# Patient Record
Sex: Male | Born: 1980 | Race: Black or African American | Hispanic: No | Marital: Single | Smoking: Current every day smoker
Health system: Southern US, Community
[De-identification: ages and names within clinical notes are randomized; demographics above are authoritative.]

## PROBLEM LIST (undated history)

## (undated) DIAGNOSIS — I10 Essential (primary) hypertension: Secondary | ICD-10-CM

---

## 2010-04-28 ENCOUNTER — Emergency Department: Payer: Self-pay

## 2011-09-01 ENCOUNTER — Emergency Department: Payer: Self-pay | Admitting: *Deleted

## 2011-11-22 ENCOUNTER — Emergency Department (HOSPITAL_COMMUNITY)
Admission: EM | Admit: 2011-11-22 | Discharge: 2011-11-22 | Discharge status: Against Medical Advice | Payer: Self-pay | Attending: Emergency Medicine | Admitting: Emergency Medicine

## 2011-11-22 ENCOUNTER — Encounter: Payer: Self-pay | Admitting: *Deleted

## 2011-11-22 DIAGNOSIS — S20219A Contusion of unspecified front wall of thorax, initial encounter: Secondary | ICD-10-CM | POA: Insufficient documentation

## 2011-11-22 DIAGNOSIS — Y9361 Activity, american tackle football: Secondary | ICD-10-CM | POA: Insufficient documentation

## 2011-11-22 DIAGNOSIS — W219XXA Striking against or struck by unspecified sports equipment, initial encounter: Secondary | ICD-10-CM | POA: Insufficient documentation

## 2011-11-22 DIAGNOSIS — R109 Unspecified abdominal pain: Secondary | ICD-10-CM | POA: Insufficient documentation

## 2011-11-22 DIAGNOSIS — M549 Dorsalgia, unspecified: Secondary | ICD-10-CM | POA: Insufficient documentation

## 2011-11-22 NOTE — ED Notes (Signed)
Patient leaving AMA

## 2011-11-22 NOTE — ED Notes (Signed)
Pt states he was playing foot ball and was kneed in left post ribs, hurst to take deep breath or move

## 2011-11-22 NOTE — ED Provider Notes (Signed)
History     CSN: [id number] Arrival date & time: 11/22/2011  5:05 PM   First MD Initiated Contact with Patient 11/22/11 1844      Chief Complaint  Patient presents with  . Back Pain    (Consider location/radiation/quality/duration/timing/severity/associated sxs/prior treatment) Patient is a 30 y.o. male presenting with back pain. The history is provided by the patient.  Back Pain  This is a new problem. The current episode started 3 to 5 hours ago. The problem occurs constantly. The problem has not changed since onset.Associated with: He was playing football and collided with another player, who fell onto left flank. He complains of worsening pain since injury, now with painful breathing. The pain is moderate. The symptoms are aggravated by bending, twisting and certain positions. The pain is worse during the day. Pertinent negatives include no chest pain and no fever. He has tried nothing for the symptoms.    History reviewed. No pertinent past medical history.  History reviewed. No pertinent past surgical history.  No family history on file.  History  Substance Use Topics  . Smoking status: Current Everyday Smoker  . Smokeless tobacco: Not on file  . Alcohol Use: Yes      Review of Systems  Constitutional: Negative for fever and chills.  HENT: Negative.   Respiratory:       Pleuritic pain posterior left - see HPI.  Cardiovascular: Negative.  Negative for chest pain.  Gastrointestinal: Negative.   Genitourinary: Positive for flank pain.       See HPI.  Skin: Negative.   Neurological: Negative.     Allergies  Review of patient's allergies indicates no known allergies.  Home Medications  No current outpatient prescriptions on file.  BP 134/80  Pulse 72  Temp(Src) 98.8 F (37.1 C) (Oral)  Resp 21  SpO2 100%  Physical Exam  Constitutional: He is oriented to person, place, and time. He appears well-developed and well-nourished.  Neck: Normal range of motion.   Pulmonary/Chest: Effort normal and breath sounds normal.  Abdominal: Soft. There is no tenderness.  Genitourinary:       Pain is not increased to palpation. No swelling, ecchymosis at left flank.   Musculoskeletal: Normal range of motion.  Neurological: He is alert and oriented to person, place, and time.  Skin: Skin is warm and dry.  Psychiatric: He has a normal mood and affect.    ED Course  Procedures (including critical care time)  Labs Reviewed - No data to display No results found.   No diagnosis found.    MDM  The patient states he cannot wait for further evaluation. Recommended chest x-ray with ribs and urine for hemoglobin but he states he cannot wait for results. He chooses to leave AMA and states he will return later tonight.        Rodena Medin, PA 11/22/11 [phone]

## 2011-11-23 ENCOUNTER — Emergency Department (HOSPITAL_COMMUNITY)
Admission: EM | Admit: 2011-11-23 | Discharge: 2011-11-23 | Disposition: A | Discharge status: Home / Self Care | Payer: Self-pay | Attending: Emergency Medicine | Admitting: Emergency Medicine

## 2011-11-23 ENCOUNTER — Encounter (HOSPITAL_COMMUNITY): Payer: Self-pay | Admitting: *Deleted

## 2011-11-23 ENCOUNTER — Emergency Department (HOSPITAL_COMMUNITY): Payer: Self-pay

## 2011-11-23 DIAGNOSIS — S20219A Contusion of unspecified front wall of thorax, initial encounter: Secondary | ICD-10-CM

## 2011-11-23 MED ORDER — KETOROLAC TROMETHAMINE 60 MG/2ML IM SOLN
60.0000 mg | Freq: Once | INTRAMUSCULAR | Status: AC
  Administered 2011-11-23: 60 mg via INTRAMUSCULAR
  Filled 2011-11-23: qty 2

## 2011-11-23 MED ORDER — NAPROXEN 500 MG PO TABS: 500.0000 mg | ORAL_TABLET | Freq: Two times a day (BID) | ORAL | Status: DC

## 2011-11-23 NOTE — ED Provider Notes (Signed)
Medical screening examination/treatment/procedure(s) were performed by non-physician practitioner and as supervising physician I was immediately available for consultation/collaboration.  Raeford Razor, MD 11/23/11 [phone]

## 2011-11-23 NOTE — ED Notes (Signed)
Pt was playing football yesterday when he was hit by what he believes was a knee in his left flank area.  Pt presented to [facility] earlier yesterday but left due to wait time.  Pt returns tonight because the pain in his back is worse.  Pt denies hematuria or vomitus.
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
## 2011-12-09 ENCOUNTER — Emergency Department: Payer: Self-pay | Admitting: Emergency Medicine

## 2012-06-17 ENCOUNTER — Emergency Department (HOSPITAL_COMMUNITY): Payer: Self-pay

## 2012-06-17 ENCOUNTER — Encounter (HOSPITAL_COMMUNITY): Payer: Self-pay | Admitting: *Deleted

## 2012-06-17 ENCOUNTER — Emergency Department (HOSPITAL_COMMUNITY)
Admission: EM | Admit: 2012-06-17 | Discharge: 2012-06-17 | Disposition: A | Discharge status: Home / Self Care | Payer: Self-pay | Attending: Emergency Medicine | Admitting: Emergency Medicine

## 2012-06-17 DIAGNOSIS — W230XXA Caught, crushed, jammed, or pinched between moving objects, initial encounter: Secondary | ICD-10-CM | POA: Insufficient documentation

## 2012-06-17 DIAGNOSIS — M79609 Pain in unspecified limb: Secondary | ICD-10-CM | POA: Insufficient documentation

## 2012-06-17 DIAGNOSIS — IMO0001 Reserved for inherently not codable concepts without codable children: Secondary | ICD-10-CM

## 2012-06-17 DIAGNOSIS — S6990XA Unspecified injury of unspecified wrist, hand and finger(s), initial encounter: Secondary | ICD-10-CM | POA: Insufficient documentation

## 2012-06-17 DIAGNOSIS — R030 Elevated blood-pressure reading, without diagnosis of hypertension: Secondary | ICD-10-CM | POA: Insufficient documentation

## 2012-06-17 DIAGNOSIS — Y9361 Activity, american tackle football: Secondary | ICD-10-CM | POA: Insufficient documentation

## 2012-06-17 MED ORDER — IBUPROFEN 600 MG PO TABS: 600.0000 mg | ORAL_TABLET | Freq: Four times a day (QID) | ORAL | Status: AC | PRN

## 2012-06-17 MED ORDER — IBUPROFEN 800 MG PO TABS
800.0000 mg | ORAL_TABLET | Freq: Once | ORAL | Status: AC
  Administered 2012-06-17: 800 mg via ORAL
  Filled 2012-06-17: qty 1

## 2012-06-17 MED ORDER — OXYCODONE-ACETAMINOPHEN 5-325 MG PO TABS: 2.0000 | ORAL_TABLET | ORAL | Status: AC | PRN

## 2012-06-17 NOTE — ED Provider Notes (Signed)
History     CSN: [id number]  Arrival date & time 06/17/12  6578   First MD Initiated Contact with Patient 06/17/12 0932      Chief Complaint  Patient presents with  . Hand Pain    (Consider location/radiation/quality/duration/timing/severity/associated sxs/prior treatment) HPI  Presents with right hand pain. Patient states he was playing football last night and "jammed my finger "while trying to tackle another person. He complains of pain and swelling to the right thumb as well as hand. He complains of numbness to the distal aspect of his right thumb. Denies paresthesias. He denies wrist pain. He denies other injury related to the fall, including blunt head trauma, neck pain, back pain. He is taking nothing at home prior to arrival. He rates his pain currently ia 6/10.   ED Notes, ED Provider Notes from 06/17/12 0000 to 06/17/12 09:26:00       Logan Buzzard, RN 06/17/2012 09:24      Pt states he was playing football yesterday and injured his hand. Pt states he applied ice to hand last night and it has continue to increase in pain and swellin     History reviewed. No pertinent past medical history.  History reviewed. No pertinent past surgical history.  No family history on file.  History  Substance Use Topics  . Smoking status: Current Everyday Smoker  . Smokeless tobacco: Not on file  . Alcohol Use: Yes    Review of Systems  All other systems reviewed and are negative.  except as noted HPI  Allergies  Review of patient's allergies indicates no known allergies.  Home Medications   Current Outpatient Rx  Name Route Sig Dispense Refill  . ESOMEPRAZOLE MAGNESIUM 40 MG PO CPDR Oral Take 40 mg by mouth daily before breakfast.    . IBUPROFEN 600 MG PO TABS Oral Take 1 tablet (600 mg total) by mouth every 6 (six) hours as needed for pain. 30 tablet 0  . OXYCODONE-ACETAMINOPHEN 5-325 MG PO TABS Oral Take 2 tablets by mouth every 4 (four) hours as needed for pain. 6  tablet 0    BP 157/100  Pulse 74  Temp 98.5 F (36.9 C) (Oral)  Resp 18  SpO2 99%  Physical Exam  Nursing note and vitals reviewed. Constitutional: He is oriented to person, place, and time. He appears well-developed and well-nourished. No distress.  HENT:  Head: Atraumatic.  Mouth/Throat: Oropharynx is clear and moist.  Eyes: Conjunctivae are normal. Pupils are equal, round, and reactive to light.  Neck: Neck supple.  Cardiovascular: Normal rate, regular rhythm, normal heart sounds and intact distal pulses.  Exam reveals no gallop and no friction rub.   No murmur heard. Pulmonary/Chest: Effort normal. No respiratory distress. He has no wheezes. He has no rales.  Abdominal: Soft. Bowel sounds are normal. There is no tenderness. There is no rebound and no guarding.  Musculoskeletal: Normal range of motion. He exhibits edema and tenderness.       Hands:      Gross sensation intact Cap refill < 3 sec Limited ROM 2/2 pain  Neurological: He is alert and oriented to person, place, and time.  Skin: Skin is warm and dry.  Psychiatric: He has a normal mood and affect.    ED Course  Procedures (including critical care time)  Labs Reviewed - No data to display Dg Hand Complete Right  06/17/2012  *RADIOLOGY REPORT*  Clinical Data: Hurt hand playing football, pain  RIGHT HAND - COMPLETE 3+  VIEW  Comparison: None  Findings: Osseous mineralization normal. Joint spaces preserved. Thin linear bony density is identified adjacent to the ulnar aspect of the distal first metacarpal, of uncertain etiology. This appears somewhat remote from the joint making this less likely represent capsular avulsion. No additional fracture, dislocation or bone destruction identified. Remaining soft tissues unremarkable.  IMPRESSION: Linear calcific density of uncertain etiology adjacent to the distal first metacarpal. This is unlikely to be related to acute trauma but recommend clinical exclusion of pain/tenderness  at this site to exclude atypical injury.  Original Report Authenticated By: Lollie Marrow, M.D.     1. Hand injury   2. Elevated blood pressure       MDM  Hand injury resulting in thumb pain/swelling. Unable to fully assess tendon injury as pt with dec ROM/severe pain. Neuro intact. XR with possible/unlikely fx. Plan for splint and f/u hand surgery for recheck. Elevated blood pressure noted likely 2/2 pain. Will recheck prior to discharge and have pt f/u with pmd for recheck 2-3 days. No EMC precluding discharge at this time. Given Precautions for return. PMD f/u.        Forbes Cellar, MD 06/17/12 1038

## 2012-06-17 NOTE — ED Notes (Signed)
Pt states he was playing football yesterday and injured his hand. Pt states he applied ice to hand last night and it has continue to increase in pain and swellin

## 2012-06-17 NOTE — ED Notes (Signed)
[facility] at bedside

## 2013-01-19 ENCOUNTER — Emergency Department: Payer: Self-pay | Admitting: Internal Medicine

## 2014-12-08 ENCOUNTER — Emergency Department: Payer: Self-pay | Admitting: Emergency Medicine

## 2014-12-08 LAB — LIPASE, BLOOD: LIPASE: 261 U/L (ref 73–393)

## 2014-12-08 LAB — CBC WITH DIFFERENTIAL/PLATELET
Basophil #: 0.1 10*3/uL (ref 0.0–0.1)
Basophil %: 0.9 %
EOS PCT: 1.5 %
Eosinophil #: 0.1 10*3/uL (ref 0.0–0.7)
HCT: 52.4 % — ABNORMAL HIGH (ref 40.0–52.0)
HGB: 17.9 g/dL (ref 13.0–18.0)
LYMPHS ABS: 1.8 10*3/uL (ref 1.0–3.6)
Lymphocyte %: 25.1 %
MCH: 31.9 pg (ref 26.0–34.0)
MCHC: 34.2 g/dL (ref 32.0–36.0)
MCV: 93 fL (ref 80–100)
Monocyte #: 1 x10 3/mm (ref 0.2–1.0)
Monocyte %: 14.4 %
NEUTROS PCT: 58.1 %
Neutrophil #: 4.2 10*3/uL (ref 1.4–6.5)
PLATELETS: 272 10*3/uL (ref 150–440)
RBC: 5.63 10*6/uL (ref 4.40–5.90)
RDW: 13.2 % (ref 11.5–14.5)
WBC: 7.3 10*3/uL (ref 3.8–10.6)

## 2014-12-08 LAB — URINALYSIS, COMPLETE
BILIRUBIN, UR: NEGATIVE
BLOOD: NEGATIVE
Bacteria: NONE SEEN
Glucose,UR: NEGATIVE mg/dL (ref 0–75)
Ketone: NEGATIVE
LEUKOCYTE ESTERASE: NEGATIVE
Nitrite: NEGATIVE
PH: 5 (ref 4.5–8.0)
PROTEIN: NEGATIVE
RBC,UR: <1 /HPF (ref 0–5)
Specific Gravity: 1.021 (ref 1.003–1.030)
WBC UR: 1 /HPF (ref 0–5)

## 2014-12-08 LAB — COMPREHENSIVE METABOLIC PANEL
ALK PHOS: 80 U/L
ALT: 42 U/L
Albumin: 3.9 g/dL (ref 3.4–5.0)
Anion Gap: 7 (ref 7–16)
BUN: 9 mg/dL (ref 7–18)
Bilirubin,Total: 0.3 mg/dL (ref 0.2–1.0)
CALCIUM: 8.7 mg/dL (ref 8.5–10.1)
CO2: 26 mmol/L (ref 21–32)
Chloride: 107 mmol/L (ref 98–107)
Creatinine: 1.16 mg/dL (ref 0.60–1.30)
GLUCOSE: 90 mg/dL (ref 65–99)
OSMOLALITY: 278 (ref 275–301)
Potassium: 3.7 mmol/L (ref 3.5–5.1)
SGOT(AST): 32 U/L (ref 15–37)
Sodium: 140 mmol/L (ref 136–145)
Total Protein: 7.8 g/dL (ref 6.4–8.2)

## 2015-11-30 ENCOUNTER — Emergency Department
Admission: EM | Admit: 2015-11-30 | Discharge: 2015-11-30 | Disposition: A | Discharge status: Home / Self Care | Payer: No Typology Code available for payment source | Attending: Emergency Medicine | Admitting: Emergency Medicine

## 2015-11-30 ENCOUNTER — Encounter: Payer: Self-pay | Admitting: Emergency Medicine

## 2015-11-30 ENCOUNTER — Emergency Department: Payer: No Typology Code available for payment source

## 2015-11-30 DIAGNOSIS — Y998 Other external cause status: Secondary | ICD-10-CM | POA: Diagnosis not present

## 2015-11-30 DIAGNOSIS — S8001XA Contusion of right knee, initial encounter: Secondary | ICD-10-CM | POA: Diagnosis not present

## 2015-11-30 DIAGNOSIS — Y9301 Activity, walking, marching and hiking: Secondary | ICD-10-CM | POA: Insufficient documentation

## 2015-11-30 DIAGNOSIS — R030 Elevated blood-pressure reading, without diagnosis of hypertension: Secondary | ICD-10-CM | POA: Diagnosis not present

## 2015-11-30 DIAGNOSIS — Z79899 Other long term (current) drug therapy: Secondary | ICD-10-CM | POA: Diagnosis not present

## 2015-11-30 DIAGNOSIS — Y92481 Parking lot as the place of occurrence of the external cause: Secondary | ICD-10-CM | POA: Diagnosis not present

## 2015-11-30 DIAGNOSIS — M25461 Effusion, right knee: Secondary | ICD-10-CM | POA: Diagnosis not present

## 2015-11-30 DIAGNOSIS — S8991XA Unspecified injury of right lower leg, initial encounter: Secondary | ICD-10-CM | POA: Diagnosis present

## 2015-11-30 DIAGNOSIS — F17200 Nicotine dependence, unspecified, uncomplicated: Secondary | ICD-10-CM | POA: Diagnosis not present

## 2015-11-30 DIAGNOSIS — IMO0001 Reserved for inherently not codable concepts without codable children: Secondary | ICD-10-CM
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
## 2015-11-30 NOTE — Discharge Instructions (Signed)
Contusion A contusion is a deep bruise. Contusions happen when an injury causes bleeding under the skin. Symptoms of bruising include pain, swelling, and discolored skin. The skin may turn blue, purple, or yellow. HOME CARE   Rest the injured area.  If told, put ice on the injured area.  Put ice in a plastic bag.  Place a towel between your skin and the bag.  Leave the ice on for 20 minutes, 2-3 times per day.  If told, put light pressure (compression) on the injured area using an elastic bandage. Make sure the bandage is not too tight. Remove it and put it back on as told by your doctor.  If possible, raise (elevate) the injured area above the level of your heart while you are sitting or lying down.  Take over-the-counter and prescription medicines only as told by your doctor. GET HELP IF:  Your symptoms do not get better after several days of treatment.  Your symptoms get worse.  You have trouble moving the injured area. GET HELP RIGHT AWAY IF:   You have very bad pain.  You have a loss of feeling (numbness) in a hand or foot.  Your hand or foot turns pale or cold.   This information is not intended to replace advice given to you by your health care provider. Make sure you discuss any questions you have with your health care provider.   Document Released: 05/12/2008 Document Revised: 08/15/2015 Document Reviewed: 04/11/2015 Elsevier Interactive Patient Education 2016 [employer]  Knee Effusion Knee effusion means that you have extra fluid in your knee. This can cause pain. Your knee may be more difficult to bend and move. HOME CARE  Use crutches as told by your doctor.  Wear a knee brace as told by your doctor.  Apply ice to the swollen area:  Put ice in a plastic bag.  Place a towel between your skin and the bag.  Leave the ice on for 20 minutes, 2-3 times per day.  Keep your knee raised (elevated) when you are sitting or lying down.  Take medicines only  as told by your doctor.  Do any rehabilitation or strengthening exercises as told by your doctor.  Rest your knee as told by your doctor. You may start doing your normal activities again when your doctor says it is okay.  Keep all follow-up visits as told by your doctor. This is important. GET HELP IF:   You continue to have pain in your knee. GET HELP RIGHT AWAY IF:  You have increased swelling or redness of your knee.  You have severe pain in your knee.  You have a fever.   This information is not intended to replace advice given to you by your health care provider. Make sure you discuss any questions you have with your health care provider.   Document Released: 12/27/2010 Document Revised: 12/15/2014 Document Reviewed: 07/10/2014 Elsevier Interactive Patient Education [employer]   Your exam and x-ray are normal today. There is no evidence of fracture or dislocation to your knee.  Apply ice and take Tylenol as needed. Monitor your blood pressure and follow[facility]up with [facility] for further evaluation and care.

## 2015-12-04 ENCOUNTER — Other Ambulatory Visit
Admission: RE | Admit: 2015-12-04 | Discharge: 2015-12-04 | Disposition: A | Discharge status: Home / Self Care | Payer: No Typology Code available for payment source | Source: Other Acute Inpatient Hospital | Attending: Family Medicine | Admitting: Family Medicine

## 2015-12-04 DIAGNOSIS — M25561 Pain in right knee: Secondary | ICD-10-CM | POA: Insufficient documentation

## 2015-12-04 DIAGNOSIS — I10 Essential (primary) hypertension: Secondary | ICD-10-CM | POA: Insufficient documentation

## 2015-12-04 DIAGNOSIS — G8929 Other chronic pain: Secondary | ICD-10-CM | POA: Diagnosis present

## 2015-12-04 LAB — BASIC METABOLIC PANEL
Anion gap: 12 (ref 5–15)
BUN: 16 mg/dL (ref 6–20)
CO2: 26 mmol/L (ref 22–32)
Calcium: 9.3 mg/dL (ref 8.9–10.3)
Chloride: 102 mmol/L (ref 101–111)
Creatinine, Ser: 0.92 mg/dL (ref 0.61–1.24)
GFR calc Af Amer: >60 mL/min (ref >60)
GFR calc non Af Amer: >60 mL/min (ref >60)
Glucose, Bld: 91 mg/dL (ref 65–99)
Potassium: 3.8 mmol/L (ref 3.5–5.1)
Sodium: 140 mmol/L (ref 135–145)

## 2016-03-26 ENCOUNTER — Encounter: Payer: Self-pay | Admitting: Medical Oncology

## 2016-03-26 ENCOUNTER — Emergency Department: Payer: No Typology Code available for payment source

## 2016-03-26 ENCOUNTER — Emergency Department
Admission: EM | Admit: 2016-03-26 | Discharge: 2016-03-26 | Disposition: A | Discharge status: Home / Self Care | Payer: No Typology Code available for payment source | Attending: Student | Admitting: Student

## 2016-03-26 DIAGNOSIS — S62336A Displaced fracture of neck of fifth metacarpal bone, right hand, initial encounter for closed fracture: Secondary | ICD-10-CM | POA: Insufficient documentation

## 2016-03-26 DIAGNOSIS — W228XXA Striking against or struck by other objects, initial encounter: Secondary | ICD-10-CM | POA: Insufficient documentation

## 2016-03-26 DIAGNOSIS — F17200 Nicotine dependence, unspecified, uncomplicated: Secondary | ICD-10-CM | POA: Insufficient documentation

## 2016-03-26 DIAGNOSIS — Y9239 Other specified sports and athletic area as the place of occurrence of the external cause: Secondary | ICD-10-CM | POA: Insufficient documentation

## 2016-03-26 DIAGNOSIS — Y9343 Activity, gymnastics: Secondary | ICD-10-CM | POA: Insufficient documentation

## 2016-03-26 DIAGNOSIS — S62339A Displaced fracture of neck of unspecified metacarpal bone, initial encounter for closed fracture: Secondary | ICD-10-CM

## 2016-03-26 DIAGNOSIS — I10 Essential (primary) hypertension: Secondary | ICD-10-CM | POA: Insufficient documentation

## 2016-03-26 DIAGNOSIS — Y999 Unspecified external cause status: Secondary | ICD-10-CM | POA: Insufficient documentation

## 2016-03-26 HISTORY — DX: Essential (primary) hypertension: I10

## 2016-03-26 MED ORDER — OXYCODONE-ACETAMINOPHEN 5-325 MG PO TABS
1.0000 | ORAL_TABLET | ORAL | Status: DC | PRN
Start: 2016-03-26 — End: 2018-03-05

## 2016-03-26 MED ORDER — OXYCODONE-ACETAMINOPHEN 5-325 MG PO TABS
2.0000 | ORAL_TABLET | Freq: Once | ORAL | Status: AC
  Administered 2016-03-26: 2 via ORAL
  Filled 2016-03-26: qty 2

## 2016-03-26 NOTE — ED Notes (Signed)
Pt reports that he was hitting punching bag at gym Friday when he heard a "pop" to his rt hand.

## 2016-03-26 NOTE — Discharge Instructions (Signed)
IMPRESSION: Acute fracture distal fifth metacarpal with volar angulation Distally  Metacarpal Fracture A metacarpal fracture is a break (fracture) of a bone in the hand. Metacarpals are the bones that extend from your knuckles to your wrist. In each hand, you have five metacarpal bones that connect your fingers and your thumb to your wrist. Some hand fractures have bone pieces that are close together and stable (simple). These fractures may be treated with only a splint or cast. Hand fractures that have many pieces of broken bone (comminuted), unstable bone pieces (displaced), or a bone that breaks through the skin (compound) usually require surgery. CAUSES This injury may be caused by:  A fall.  A hard, direct hit to your hand.  An injury that squeezes your knuckle, stretches your finger out of place, or crushes your hand. RISK FACTORS This injury is more likely to occur if:  You play contact sports.  You have certain bone diseases. SYMPTOMS  Symptoms of this type of fracture develop soon after the injury. Symptoms may include:  Swelling.  Pain.  Stiffness.  Increased pain with movement.  Bruising.  Inability to move a finger.  A shortened finger.  A finger knuckle that looks sunken in.  Unusual appearance of the hand or finger (deformity). DIAGNOSIS  This injury may be diagnosed based on your signs and symptoms, especially if you had a recent hand injury. Your health care provider will perform a physical exam. He or she may also order X-rays to confirm the diagnosis.  TREATMENT  Treatment for this injury depends on the type of fracture you have and how severe it is. Possible treatments include:  Non-reduction. This can be done if the bone does not need to be moved back into place. The fracture can be casted or splinted as it is.   Closed reduction. If your bone is stable and can be moved back into place, you may only need to wear a cast or splint or have buddy  taping.  Closed reduction with internal fixation (CRIF). This is the most common treatment. You may have this procedure if your bone can be moved back into place but needs more support. Wires, pins, or screws may be inserted through your skin to stabilize the fracture.  Open reduction with internal fixation (ORIF). This may be needed if your fracture is severe and unstable. It involves surgery to move your bone back into the right position. Screws, wires, or plates are used to stabilize the fracture. After all procedures, you may need to wear a cast or a splint for several weeks. You will also need to have follow-up X-rays to make sure that the bone is healing well and staying in position. After you no longer need your cast or splint, you may need physical therapy. This will help you to regain full movement and strength in your hand.  HOME CARE INSTRUCTIONS  If You Have a Cast:  Do not stick anything inside the cast to scratch your skin. Doing that increases your risk of infection.  Check the skin around the cast every day. Report any concerns to your health care provider. You may put lotion on dry skin around the edges of the cast. Do not apply lotion to the skin underneath the cast. If You Have a Splint:  Wear it as directed by your health care provider. Remove it only as directed by your health care provider.  Loosen the splint if your fingers become numb and tingle, or if they turn cold  and blue. Bathing  Cover the cast or splint with a watertight plastic bag to protect it from water while you take a bath or a shower. Do not let the cast or splint get wet. Managing Pain, Stiffness, and Swelling  If directed, apply ice to the injured area (if you have a splint, not a cast):  Put ice in a plastic bag.  Place a towel between your skin and the bag.  Leave the ice on for 20 minutes, 2-3 times a day.  Move your fingers often to avoid stiffness and to lessen swelling.  Raise the injured  area above the level of your heart while you are sitting or lying down. Driving  Do not drive or operate heavy machinery while taking pain medicine.  Do not drive while wearing a cast or splint on a hand that you use for driving. Activity  Return to your normal activities as directed by your health care provider. Ask your health care provider what activities are safe for you. General Instructions  Do not put pressure on any part of the cast or splint until it is fully hardened. This may take several hours.  Keep the cast or splint clean and dry.  Do not use any tobacco products, including cigarettes, chewing tobacco, or electronic cigarettes. Tobacco can delay bone healing. If you need help quitting, ask your health care provider.  Take medicines only as directed by your health care provider.  Keep all follow-up visits as directed by your health care provider. This is important. SEEK MEDICAL CARE IF:   Your pain is getting worse.  You have redness, swelling, or pain in the injured area.   You have fluid, blood, or pus coming from under your cast or splint.   You notice a bad smell coming from under your cast or splint.   You have a fever.  SEEK IMMEDIATE MEDICAL CARE IF:   You develop a rash.   You have trouble breathing.   Your skin or nails on your injured hand turn blue or gray even after you loosen your splint.  Your injured hand feels cold or becomes numb even after you loosen your splint.   You develop severe pain under the cast or in your hand.   This information is not intended to replace advice given to you by your health care provider. Make sure you discuss any questions you have with your health care provider.   Document Released: 11/24/2005 Document Revised: 08/15/2015 Document Reviewed: 09/13/2014 Elsevier Interactive Patient Education 2016 Elsevier Inc.   Boxer's Knuckle Boxer's knuckle is an injury to an extensor tendon. The extensor tendons  are located on the back of the hand. They help the fingers to extend. They also help to protect the finger bones and joints. Boxer's knuckle develops if the layer of tissue that lies over these tendons becomes damaged and causes a tendon to move out of position. Boxer's knuckle often affects the first knuckle of the middle finger. CAUSES This condition is caused by direct or repeated injury (trauma) to a knuckle. If often happens during activities such boxing or martial arts.  RISK FACTORS This condition is more likely to develop in:  People who participate in hitting or fighting sports, such as boxing and martial arts.  People who play contact sports, such as football and rugby.  People who have poor strength and flexibility.  People who have injured a knuckle. SYMPTOMS  Symptoms of this condition include:  Pain and swelling over the  injured knuckle.  Difficulty straightening the affected finger.  Delay when you try to straighten the affected finger.  Tenderness when you touch the injured knuckle.  Abnormal movement of the affected tendon when you open and close your hand. DIAGNOSIS This condition is diagnosed with a physical exam. Sometimes, X-rays are taken to check for additional problems, such as a fracture or cyst in the bone under the injured area. TREATMENT This condition may be treated with:  Ice applied to the affected area.  Medicines for pain.  Placing the hand in a cast or splint to keep the injured joint from moving while the tendon heals.  Surgery to repair the injured tendon or tissue. This may be done in severe cases. HOME CARE INSTRUCTIONS If You Have a Cast:  Do not stick anything inside the cast to scratch your skin. Doing that increases your risk of infection.  Check the skin around the cast every day. Report any concerns to your health care provider. You may put lotion on dry skin around the edges of the cast. Do not apply lotion to the skin underneath  the cast.  Keep the cast clean and dry. If You Have a Splint:  Wear it as told by your health care provider. Remove it only as told by your health care provider.  Loosen the splint if your fingers become numb and tingle, or if they turn cold and blue.  Keep the splint clean and dry. Bathing  Do not take baths, swim, or use a hot tub until your health care provider approves. Ask your health care provider if you can take showers. You may only be allowed to take sponge baths for bathing.  If your health care provider approves bathing and showering, cover the cast or splint with a watertight plastic bag to protect it from water. Do not let the cast or splint get wet. Managing Pain, Stiffness, and Swelling  If directed, apply ice to the injured area.  Put ice in a plastic bag.  Place a towel between your skin and the bag.  Leave the ice on for 20 minutes, 2-3 times per day. Driving  Do not drive or operate heavy machinery while taking prescription pain medicine.  Ask your health care provider when it is safe to drive if you have a cast or splint on your hand. General Instructions  Do not put pressure on any part of the cast or splint until it is fully hardened. This may take several hours.  Do not use any tobacco products, including cigarettes, chewing tobacco, or e-cigarettes. Tobacco can delay bone healing. If you need help quitting, ask your health care provider.  Take over-the-counter and prescription medicines only as told by your health care provider.  Keep all follow-up visits as told by your health care provider. This is important. SEEK MEDICAL CARE IF:  Your pain gets worse.  You hand tingles or feels numb.  Your hand becomes discolored.   This information is not intended to replace advice given to you by your health care provider. Make sure you discuss any questions you have with your health care provider.   Document Released: 11/24/2005 Document Revised:  08/15/2015 Document Reviewed: 01/25/2015 Elsevier Interactive Patient Education [employer] Elsevier Inc.

## 2016-03-26 NOTE — ED Provider Notes (Signed)
[facility] Emergency Department Provider Note  ____________________________________________  Time seen: Approximately 11:37 AM  I have reviewed the triage vital signs and the nursing notes.   HISTORY  Chief Complaint Hand Pain    HPI Logan Lamb is a 35 y.o. male who presents for evaluation of punching a bag at the gym on Friday when he heard a pop to his right hand. Patient complains of continuous pain today 5 days later. Patient reports past medical history of trauma to the same hand and knuckle approximately 10 years ago almost 20 years ago. Patient describes his pain as 9/10 with no numbness or tingling. Point tenderness noted to the base of the fifth finger according to the patient. No relief with over-the-counter medications.   Past Medical History  Diagnosis Date  . Hypertension     There are no active problems to display for this patient.   History reviewed. No pertinent past surgical history.  Current Outpatient Rx  Name  Route  Sig  Dispense  Refill  . esomeprazole (NEXIUM) 40 MG capsule   Oral   Take 40 mg by mouth daily before breakfast.         . oxyCODONE-acetaminophen (ROXICET) 5-325 MG tablet   Oral   Take 1-2 tablets by mouth every 4 (four) hours as needed for severe pain.   15 tablet   0     Allergies Review of patient's allergies indicates no known allergies.  No family history on file.  Social History Social History  Substance Use Topics  . Smoking status: Current Every Day Smoker  . Smokeless tobacco: None  . Alcohol Use: Yes    Review of Systems Constitutional: No fever/chills Musculoskeletal: Positive for right hand pain fifth digit. Skin: Negative for rash. Neurological: Negative for headaches, focal weakness or numbness.  10-point ROS otherwise negative.  ____________________________________________   PHYSICAL EXAM:  VITAL SIGNS: ED Triage Vitals  Enc Vitals Group     BP 03/26/16 1048 159/98  mmHg     Pulse Rate 03/26/16 1048 67     Resp 03/26/16 1048 18     Temp 03/26/16 1048 98.3 F (36.8 C)     Temp Source 03/26/16 1048 Oral     SpO2 03/26/16 1048 100 %     Weight 03/26/16 1048 178 lb (80.74 kg)     Height 03/26/16 1048  (1.651 m)     Head Cir --      Peak Flow --      Pain Score 03/26/16 1048 9     Pain Loc --      Pain Edu? --      Excl. in GC? --     Constitutional: Alert and oriented. Well appearing and in no acute distress. Musculoskeletal:Right hand fifth pain. Positive edema and swellingto the fifth finger. Minimal ecchymosis noted point tenderness appreciated. Neurologic:  Normal speech and language. No gross focal neurologic deficits are appreciated. No gait instability. Skin:  Skin is warm, dry and intact. No rash noted. Psychiatric: Mood and affect are normal. Speech and behavior are normal.  ____________________________________________   LABS (all labs ordered are listed, but only abnormal results are displayed)  Labs Reviewed - No data to display   RADIOLOGY  IMPRESSION: Acute fracture distal fifth metacarpal with volar angulation distally. There is also evidence of old fracture with remodeling in this area. No other fractures. No dislocation. No appreciable arthropathy. ____________________________________________   PROCEDURES  Procedure(s) performed: None  Critical Care  performed: No  ____________________________________________   INITIAL IMPRESSION / ASSESSMENT AND PLAN / ED COURSE  Pertinent labs & imaging results that were available during my care of the patient were reviewed by me and considered in my medical decision making (see chart for details).  Boxer's fracture with some volar angulation. Patient placed in ulnar gutter splint and referred to orthopedic on call. Discussed clinical findings with Dr. Luiz OchoaPucci and he agrees to see the patient is office this week. For Percocet 5/325 for  pain. ____________________________________________   FINAL CLINICAL IMPRESSION(S) / ED DIAGNOSES  Final diagnoses:  Boxer's fracture, closed, initial encounter     This chart was dictated using voice recognition software/Dragon. Despite best efforts to proofread, errors can occur which can change the meaning. Any change was purely unintentional.   Evangeline Dakinharles M Seara Hinesley, PA-C 03/26/16 1152  Gayla DossEryka A Gayle, MD 03/26/16 1525

## 2016-03-26 NOTE — ED Notes (Signed)
Patient at xray

## 2016-07-28 ENCOUNTER — Emergency Department: Payer: Self-pay

## 2016-07-28 ENCOUNTER — Encounter: Payer: Self-pay | Admitting: Emergency Medicine

## 2016-07-28 ENCOUNTER — Emergency Department
Admission: EM | Admit: 2016-07-28 | Discharge: 2016-07-29 | Disposition: A | Discharge status: Home / Self Care | Payer: Self-pay | Attending: Emergency Medicine | Admitting: Emergency Medicine

## 2016-07-28 DIAGNOSIS — R109 Unspecified abdominal pain: Secondary | ICD-10-CM

## 2016-07-28 DIAGNOSIS — F17200 Nicotine dependence, unspecified, uncomplicated: Secondary | ICD-10-CM | POA: Insufficient documentation

## 2016-07-28 DIAGNOSIS — I10 Essential (primary) hypertension: Secondary | ICD-10-CM

## 2016-07-28 DIAGNOSIS — Z79899 Other long term (current) drug therapy: Secondary | ICD-10-CM | POA: Insufficient documentation

## 2016-07-28 DIAGNOSIS — R079 Chest pain, unspecified: Secondary | ICD-10-CM

## 2016-07-28 LAB — BASIC METABOLIC PANEL
Anion gap: 6 (ref 5–15)
BUN: 17 mg/dL (ref 6–20)
CALCIUM: 9.6 mg/dL (ref 8.9–10.3)
CHLORIDE: 104 mmol/L (ref 101–111)
CO2: 28 mmol/L (ref 22–32)
CREATININE: 1.41 mg/dL — AB (ref 0.61–1.24)
GFR calc non Af Amer: >60 mL/min (ref >60)
Glucose, Bld: 83 mg/dL (ref 65–99)
Potassium: 3.9 mmol/L (ref 3.5–5.1)
SODIUM: 138 mmol/L (ref 135–145)

## 2016-07-28 LAB — CBC
HEMATOCRIT: 48.4 % (ref 40.0–52.0)
HEMOGLOBIN: 17 g/dL (ref 13.0–18.0)
MCH: 31.5 pg (ref 26.0–34.0)
MCHC: 35.2 g/dL (ref 32.0–36.0)
MCV: 89.7 fL (ref 80.0–100.0)
Platelets: 253 10*3/uL (ref 150–440)
RBC: 5.39 MIL/uL (ref 4.40–5.90)
RDW: 13.6 % (ref 11.5–14.5)
WBC: 11.3 10*3/uL — ABNORMAL HIGH (ref 3.8–10.6)

## 2016-07-28 LAB — TROPONIN I: Troponin I: <0.03 ng/mL (ref <0.03)

## 2016-07-28 MED ORDER — OXYCODONE-ACETAMINOPHEN 5-325 MG PO TABS
ORAL_TABLET | ORAL | Status: AC
  Administered 2016-07-28: 1 via ORAL
  Filled 2016-07-28: qty 1

## 2016-07-28 MED ORDER — OXYCODONE-ACETAMINOPHEN 5-325 MG PO TABS
1.0000 | ORAL_TABLET | Freq: Once | ORAL | Status: AC
  Administered 2016-07-28: 1 via ORAL

## 2016-07-28 NOTE — ED Provider Notes (Addendum)
[facility] Emergency Department Provider Note   ____________________________________________   First MD Initiated Contact with Patient 07/28/16 2340     (approximate)  I have reviewed the triage vital signs and the nursing notes.   HISTORY  Chief Complaint Chest Pain   HPI Logan Lamb is a 35 y.o. male with a history of hypertension on lisinopril who is presenting to the emergency department with right-sided chest pain. He says the pain is been constant since yesterday as a 10 out of 10 at this time. He says that he was lying awkwardly on a couch watching television and when he got up he said he started having the pain. He describes the pain as an aching pain to the right side of the chest which was originally in the right back. He is now pain-free and his back. He says that he does have shortness of breath associated with this and that the shortness of breath is also constant. He denies any nausea vomiting or diarrhea. Said that he tried Tums today as well as ibuprofen which did not help. He says that movement exacerbates the pain and that the pain occasionally becomes very sharp and these episodes are very short-lived lasting several seconds.Denies any worsening of the pain with deep breathing. Although noted in the triage note, the patient is denying any abdominal pain. Also says that he is moving his bowels regularly.  He says that he smokes cigarettes but denies any drinking or drug use. No history of cardiac disease.  Past Medical History:  Diagnosis Date  . Hypertension     There are no active problems to display for this patient.   History reviewed. No pertinent surgical history.  Prior to Admission medications   Medication Sig Start Date End Date Taking? Authorizing Provider  lisinopril (PRINIVIL,ZESTRIL) 10 MG tablet Take 10 mg by mouth daily.   Yes Historical Provider, MD  esomeprazole (NEXIUM) 40 MG capsule Take 40 mg by mouth daily before  breakfast.    Historical Provider, MD  oxyCODONE-acetaminophen (ROXICET) 5-325 MG tablet Take 1-2 tablets by mouth every 4 (four) hours as needed for severe pain. 03/26/16   Evangeline Dakin, PA-C    Allergies Review of patient's allergies indicates no known allergies.  No family history on file.  Social History Social History  Substance Use Topics  . Smoking status: Current Every Day Smoker  . Smokeless tobacco: Former Neurosurgeon  . Alcohol use Yes    Review of Systems Constitutional: No fever/chills Eyes: No visual changes. ENT: No sore throat. Cardiovascular: As above Respiratory: As above Gastrointestinal: No abdominal pain.  No nausea, no vomiting.  No diarrhea.  No constipation. Genitourinary: Negative for dysuria. Musculoskeletal: Negative for back pain. Skin: Negative for rash. Neurological: Negative for headaches, focal weakness or numbness.  10-point ROS otherwise negative.  ____________________________________________   PHYSICAL EXAM:  VITAL SIGNS: ED Triage Vitals  Enc Vitals Group     BP 07/28/16 2048 (!) 157/94     Pulse Rate 07/28/16 2048 73     Resp 07/28/16 2048 18     Temp 07/28/16 2048 98.3 F (36.8 C)     Temp Source 07/28/16 2048 Oral     SpO2 07/28/16 2048 99 %     Weight 07/28/16 2044 179 lb (81.2 kg)     Height 07/28/16 2044 5\' 5"  (1.651 m)     Head Circumference --      Peak Flow --      Pain  Score 07/28/16 2044 8     Pain Loc --      Pain Edu? --      Excl. in GC? --     Constitutional: Alert and oriented. Well appearing and in no acute distress. Eyes: Conjunctivae are normal. PERRL. EOMI. Head: Atraumatic. Nose: No congestion/rhinnorhea. Mouth/Throat: Mucous membranes are moist.   Neck: No stridor.   Cardiovascular: Normal rate, regular rhythm. Grossly normal heart sounds.  Chest pain is reproducible over the right lateral ribs under the axilla. No point tenderness. No crepitus. Respiratory: Normal respiratory effort.  No retractions.  Lungs CTAB. Gastrointestinal: Soft with very mild right upper quadrant tenderness palpation with a negative Murphy sign. No distention. No abdominal bruits. No CVA tenderness. Musculoskeletal: No lower extremity tenderness nor edema.  No joint effusions. Neurologic:  Normal speech and language. No gross focal neurologic deficits are appreciated. No gait instability. Skin:  Skin is warm, dry and intact. No rash noted. Psychiatric: Mood and affect are normal. Speech and behavior are normal.  ____________________________________________   LABS (all labs ordered are listed, but only abnormal results are displayed)  Labs Reviewed  BASIC METABOLIC PANEL - Abnormal; Notable for the following:       Result Value   Creatinine, Ser 1.41 (*)    All other components within normal limits  CBC - Abnormal; Notable for the following:    WBC 11.3 (*)    All other components within normal limits  HEPATIC FUNCTION PANEL - Abnormal; Notable for the following:    Bilirubin, Direct <0.1 (*)    All other components within normal limits  TROPONIN I  LIPASE, BLOOD   ____________________________________________  EKG  ED ECG REPORT I, Arelia LongestSchaevitz,  Hector Venne M, the attending physician, personally viewed and interpreted this ECG.   Date: 07/29/2016  EKG Time: 2047  Rate: 75  Rhythm: normal sinus rhythm  Axis: Normal axis  Intervals:none  ST&T Change: No ST segment elevation or depression. No abnormal T-wave inversion.  ____________________________________________  RADIOLOGY  DG Chest 2 View (Accession [id number]) (Order [id number])  Imaging  Date: 07/28/2016 Department: [facility] EMERGENCY DEPARTMENT Released By: Gordy LevanLisa Flynn Thompson, RN (auto-released) Authorizing: Myrna Blazeravid Matthew Kinsey Karch, MD  PACS Images   Show images for DG Chest 2 View  Study Result   CLINICAL DATA:  Chest pain  EXAM: CHEST  2 VIEW  COMPARISON:  11/23/2011  FINDINGS: Normal heart size and  mediastinal contours. No acute infiltrate or edema. No effusion or pneumothorax. No acute osseous findings.  IMPRESSION: Negative chest   Electronically Signed   By: Marnee SpringJonathon  Watts M.D.   On: 07/28/2016 21:28    ____________________________________________   PROCEDURES  Procedure(s) performed:   Procedures  Critical Care performed:   ____________________________________________   INITIAL IMPRESSION / ASSESSMENT AND PLAN / ED COURSE  Pertinent labs & imaging results that were available during my care of the patient were reviewed by me and considered in my medical decision making (see chart for details).  Patient with elevated blood pressure. Says he did not take his lisinopril today but still has a week's worth at home. I also discussed with him the possibility of this being his gallbladder. However, he says that he would not like an ultrasound this time and would just like the lab results.  Clinical Course   ----------------------------------------- 12:59 AM on 07/29/2016 -----------------------------------------  Patient is resting comfortably at this time. Still hypertensive. No acute distress. Very reassuring lab workup. Slightly elevated white blood cell count but  without any other concerning lab findings. I reexamined his abdomen and he has no right upper quadrant tenderness at this time. Feel that gallbladder pathology would be less likely because of no GI symptoms as well as very reassuring lab work as far as his hepatic function testing and lipase. Pain is improved with Percocet. Also the pain seems to be exacerbated the most when the patient is moving. Feel that the most likely cause is musculoskeletal. However, we did discuss gallbladder issues. The patient will be [facility]. He requested a refill of his lisinopril. We'll also give him a dose of lisinopril prior to discharge because of his elevated hypertension here. PERC negative.  Patient understands to return to the emergency department for any worsening or concerning symptoms.  ____________________________________________   FINAL CLINICAL IMPRESSION(S) / ED DIAGNOSES  Right-sided chest pain.    NEW MEDICATIONS STARTED DURING THIS VISIT:  New Prescriptions   No medications on file     Note:  This document was prepared using Dragon voice recognition software and may include unintentional dictation errors.    Myrna Blazeravid Matthew Elizebeth Kluesner, MD 07/29/16 0100  HEART score of 1.  Denies any family history of cardiac disease.  Also discussed possible kidney stone, but I feel this is less likely given the high, right sided chest pain.  Pt also very calm in the room and without any distress.  If it was a stone I would suspect it would likely pass on its own.    Myrna Blazeravid Matthew Arrin Pintor, MD 07/29/16 [phone]

## 2016-07-28 NOTE — ED Triage Notes (Signed)
Patient ambulatory to triage with steady gait, without difficulty or distress noted; pt [facility]; denies hx of same

## 2016-07-28 NOTE — ED Notes (Signed)
Pt reports chest/abd pain - chest hurting over right breast - abd hurting under right breast - pain initially was in the back last pm - pt denies nausea/vomiting - pt reports shortness of breath (respirations are even and unlabored with no distress noted)

## 2016-07-29 LAB — HEPATIC FUNCTION PANEL
ALK PHOS: 71 U/L (ref 38–126)
ALT: 22 U/L (ref 17–63)
AST: 25 U/L (ref 15–41)
Albumin: 4.5 g/dL (ref 3.5–5.0)
BILIRUBIN TOTAL: 0.3 mg/dL (ref 0.3–1.2)
Total Protein: 7.5 g/dL (ref 6.5–8.1)

## 2016-07-29 LAB — LIPASE, BLOOD: Lipase: 23 U/L (ref 11–51)

## 2016-07-29 MED ORDER — LISINOPRIL 10 MG PO TABS
10.0000 mg | ORAL_TABLET | Freq: Once | ORAL | Status: AC
  Administered 2016-07-29: 10 mg via ORAL
  Filled 2016-07-29: qty 1

## 2016-07-29 MED ORDER — CARISOPRODOL 350 MG PO TABS: 350.0000 mg | ORAL_TABLET | Freq: Three times a day (TID) | ORAL | 0 refills | Status: AC | PRN

## 2016-07-29 MED ORDER — LISINOPRIL 10 MG PO TABS: 10.0000 mg | ORAL_TABLET | Freq: Every day | ORAL | 0 refills | Status: DC

## 2017-03-01 ENCOUNTER — Emergency Department
Admission: EM | Admit: 2017-03-01 | Discharge: 2017-03-01 | Disposition: A | Discharge status: Home / Self Care | Payer: Self-pay | Attending: Emergency Medicine | Admitting: Emergency Medicine

## 2017-03-01 DIAGNOSIS — Z79899 Other long term (current) drug therapy: Secondary | ICD-10-CM | POA: Insufficient documentation

## 2017-03-01 DIAGNOSIS — I10 Essential (primary) hypertension: Secondary | ICD-10-CM | POA: Insufficient documentation

## 2017-03-01 DIAGNOSIS — A5401 Gonococcal cystitis and urethritis, unspecified: Secondary | ICD-10-CM | POA: Insufficient documentation

## 2017-03-01 DIAGNOSIS — F17200 Nicotine dependence, unspecified, uncomplicated: Secondary | ICD-10-CM | POA: Insufficient documentation

## 2017-03-01 LAB — URINALYSIS, COMPLETE (UACMP) WITH MICROSCOPIC
Bilirubin Urine: NEGATIVE
GLUCOSE, UA: NEGATIVE mg/dL
HGB URINE DIPSTICK: NEGATIVE
Ketones, ur: NEGATIVE mg/dL
NITRITE: NEGATIVE
Protein, ur: 100 mg/dL — AB
SPECIFIC GRAVITY, URINE: 1.017 (ref 1.005–1.030)
Squamous Epithelial / LPF: NONE SEEN
pH: 5 (ref 5.0–8.0)

## 2017-03-01 LAB — CHLAMYDIA/NGC RT PCR (ARMC ONLY)
Chlamydia Tr: NOT DETECTED
N gonorrhoeae: DETECTED — AB

## 2017-03-01 MED ORDER — ONDANSETRON 4 MG PO TBDP
4.0000 mg | ORAL_TABLET | Freq: Once | ORAL | Status: AC
  Administered 2017-03-01: 4 mg via ORAL
  Filled 2017-03-01: qty 1

## 2017-03-01 MED ORDER — CEFTRIAXONE SODIUM 250 MG IJ SOLR
250.0000 mg | Freq: Once | INTRAMUSCULAR | Status: AC
  Administered 2017-03-01: 250 mg via INTRAMUSCULAR
  Filled 2017-03-01: qty 250

## 2017-03-01 MED ORDER — AZITHROMYCIN 250 MG PO TABS
1000.0000 mg | ORAL_TABLET | Freq: Once | ORAL | Status: AC
  Administered 2017-03-01: 1000 mg via ORAL
  Filled 2017-03-01: qty 4

## 2017-03-01 MED ORDER — LISINOPRIL 10 MG PO TABS
10.0000 mg | ORAL_TABLET | Freq: Once | ORAL | Status: DC
  Filled 2017-03-01: qty 1

## 2017-03-01 MED ORDER — LISINOPRIL 10 MG PO TABS: 10.0000 mg | ORAL_TABLET | Freq: Every day | ORAL | 0 refills | Status: DC

## 2017-03-01 NOTE — ED Notes (Signed)
Pt. States yellow discharge from penis that started today.  Pt. States last sexual encounter was with wife friday.  Pt. Denies he has had any sexual encounters with anyone else.

## 2017-03-01 NOTE — ED Triage Notes (Signed)
Pt states that he started having lower abd pain with discharge from his penis this am, states discomfort with urination

## 2017-03-01 NOTE — ED Provider Notes (Signed)
[facility] Emergency Department Provider Note  ____________________________________________  Time seen: Approximately 4:55 PM  I have reviewed the triage vital signs and the nursing notes.   HISTORY  Chief Complaint Abdominal Pain    HPI Logan Lamb is a 36 y.o. male who is sexually active and monogamous with one woman presenting with purulent discharge from the penis and pain with urination. The patient denies any testicular or scrotal complaints, fever or chills, nausea or vomiting.   Past Medical History:  Diagnosis Date  . Hypertension     There are no active problems to display for this patient.   No past surgical history on file.  Current Outpatient Rx  . Order #: [id number] Class: Print  . Order #: [id number] Class: Historical Med  . Order #: [id number] Class: Print  . Order #: [id number] Class: Print    Allergies Patient has no known allergies.  No family history on file.  Social History Social History  Substance Use Topics  . Smoking status: Current Every Day Smoker  . Smokeless tobacco: Former NeurosurgeonUser  . Alcohol use Yes    Review of Systems Constitutional: No fever/chills. Eyes: No visual changes. ENT: No sore throat. No congestion or rhinorrhea. Respiratory: Denies shortness of breath.  No cough. Gastrointestinal: No abdominal pain.  No nausea, no vomiting.  No diarrhea.  No constipation. Genitourinary: Positive for purulent discharge from the penis. Negative for penile lesions. Negative for testicular or scrotal pain. Positive for dysuria. Musculoskeletal: Negative for back pain. Skin: Negative for rash. 10-point ROS otherwise negative.  ____________________________________________   PHYSICAL EXAM:  VITAL SIGNS: ED Triage Vitals  Enc Vitals Group     BP 03/01/17 1528 (!) 189/114     Pulse Rate 03/01/17 1528 (!) 112     Resp 03/01/17 1528 16     Temp 03/01/17 1528 98.1 F (36.7 C)     Temp Source 03/01/17 1528 Oral      SpO2 03/01/17 1528 99 %     Weight 03/01/17 1529 180 lb (81.6 kg)     Height 03/01/17 1529 5\' 5"  (1.651 m)     Head Circumference --      Peak Flow --      Pain Score --      Pain Loc --      Pain Edu? --      Excl. in GC? --     Constitutional: Alert and oriented. Well appearing and in no acute distress. Answers questions appropriately. Eyes: Conjunctivae are normal.  EOMI. No scleral icterus. Head: Atraumatic. Nose: No congestion/rhinnorhea. Mouth/Throat: Mucous membranes are moist.  Neck: No stridor.  Supple.   Cardiovascular: Normal rate Respiratory: Normal respiratory effort.  No accessory muscle use or retractions. Gastrointestinal: Soft, nontender and nondistended.  No guarding or rebound.  No peritoneal signs. Genitourinary: Circumcised male with purulent discharge at the meatus. No evidence of penile lesions. Normal testicular lie without any swelling or erythema. No palpable masses or pain on testicular examination. Inguinal canals without any tenderness or palpable hernias. Musculoskeletal: No LE edema.  Neurologic:  A&Ox3.  Speech is clear.  Face and smile are symmetric.  EOMI.  Moves all extremities well. Skin:  Skin is warm, dry and intact. No rash noted. Psychiatric: Mood and affect are normal. Speech and behavior are normal.  Normal judgement.  ____________________________________________   LABS (all labs ordered are listed, but only abnormal results are displayed)  Labs Reviewed  CHLAMYDIA/NGC RT PCR ([facility] ONLY) - Abnormal; Notable for the following:  Result Value   N gonorrhoeae DETECTED (*)    All other components within normal limits  URINALYSIS, COMPLETE (UACMP) WITH MICROSCOPIC - Abnormal; Notable for the following:    Color, Urine YELLOW (*)    APPearance HAZY (*)    Protein, ur 100 (*)    Leukocytes, UA LARGE (*)    Bacteria, UA RARE (*)    All other components within normal limits    ____________________________________________  EKG  Not indicated ____________________________________________  RADIOLOGY  No results found.  ____________________________________________   PROCEDURES  Procedure(s) performed: None  Procedures  Critical Care performed: No ____________________________________________   INITIAL IMPRESSION / ASSESSMENT AND PLAN / ED COURSE  Pertinent labs & imaging results that were available during my care of the patient were reviewed by me and considered in my medical decision making (see chart for details).  36 y.o. male, otherwise healthy, presenting with purulent penile discharge, no other symptoms or findings on clinical examination. I'm concerned about coronary committee in this patient, awaiting the results of his GC chlamydia testing, as well as his urinalysis. I have had a long discussion with the patient about getting evaluated for HIV at the Public health Department or with his primary care physician. We will plan to treat him for sexual transmitted illness, and I have encouraged him to let his wife know the results of this test. He has been counseled not to resume sexual relations until he and his wife or other possible partners have been tested and treated.  ----------------------------------------- 5:44 PM on 03/01/2017 -----------------------------------------  The patient's blood pressure is markedly elevated on 2 separate readings. He reports that he has not taken his lisinopril for several months and has been out of that medication. I will treat him with lisinopril here, but at this time he is not symptomatic from his hypertension. He does not have headache, blurred vision, chest pain or any neurologic symptoms. No further evaluation for this is required in the emergency department.  ----------------------------------------- 5:59 PM on 03/01/2017 -----------------------------------------  The patient did test positive for  gonorrhea, and will be treated for gonorrhea and chlamydia concomitantly due to the high rate of co-infection. Plan discharge at this time. ____________________________________________  FINAL CLINICAL IMPRESSION(S) / ED DIAGNOSES  Final diagnoses:  Essential hypertension  Urethritis, gonococcal, acute         NEW MEDICATIONS STARTED DURING THIS VISIT:  Current Discharge Medication List        Rockne Menghini, MD 03/01/17 1759

## 2017-03-01 NOTE — Discharge Instructions (Signed)
Today you tested positive for gonorrhea. This is a sexually transmitted disease, and all your partners need to be tested and treated before you resume sexual relations. Please have your primary care doctor, or the Public health Department, test you for HIV.  Please talk to your primary care physician about your elevated blood pressure.  Return to the emergency department if you develop severe pain, headache, chest pain, numbness tingling or weakness, fever, vomiting, or any other symptoms concerning to you.

## 2017-03-01 NOTE — ED Notes (Signed)
Pt. Going home with wife. 

## 2017-04-29 ENCOUNTER — Encounter: Payer: Self-pay | Admitting: Emergency Medicine

## 2017-04-29 ENCOUNTER — Emergency Department
Admission: EM | Admit: 2017-04-29 | Discharge: 2017-04-30 | Disposition: A | Discharge status: Home / Self Care | Payer: Self-pay | Attending: Emergency Medicine | Admitting: Emergency Medicine

## 2017-04-29 DIAGNOSIS — F17200 Nicotine dependence, unspecified, uncomplicated: Secondary | ICD-10-CM | POA: Insufficient documentation

## 2017-04-29 DIAGNOSIS — I10 Essential (primary) hypertension: Secondary | ICD-10-CM | POA: Insufficient documentation

## 2017-04-29 NOTE — ED Triage Notes (Signed)
Pt ambulatory to triage with steady gait, no distress noted. Pt reports blurred vision, headaches and hypertension x2 days. Pt take lisinopril daily and is not seeing improvements to BP with medication.

## 2017-04-30 LAB — BASIC METABOLIC PANEL
ANION GAP: 9 (ref 5–15)
BUN: 14 mg/dL (ref 6–20)
CO2: 26 mmol/L (ref 22–32)
CREATININE: 1.14 mg/dL (ref 0.61–1.24)
Calcium: 9.4 mg/dL (ref 8.9–10.3)
Chloride: 102 mmol/L (ref 101–111)
GLUCOSE: 112 mg/dL — AB (ref 65–99)
Potassium: 3.2 mmol/L — ABNORMAL LOW (ref 3.5–5.1)
Sodium: 137 mmol/L (ref 135–145)

## 2017-04-30 LAB — CBC
HCT: 52.4 % — ABNORMAL HIGH (ref 40.0–52.0)
Hemoglobin: 18.1 g/dL — ABNORMAL HIGH (ref 13.0–18.0)
MCH: 31.5 pg (ref 26.0–34.0)
MCHC: 34.5 g/dL (ref 32.0–36.0)
MCV: 91.2 fL (ref 80.0–100.0)
PLATELETS: 279 10*3/uL (ref 150–440)
RBC: 5.75 MIL/uL (ref 4.40–5.90)
RDW: 13.4 % (ref 11.5–14.5)
WBC: 9.8 10*3/uL (ref 3.8–10.6)

## 2017-04-30 LAB — TROPONIN I: Troponin I: <0.03 ng/mL (ref <0.03)

## 2017-04-30 MED ORDER — METOPROLOL TARTRATE 25 MG PO TABS: 25.0000 mg | ORAL_TABLET | Freq: Two times a day (BID) | ORAL | 1 refills | Status: DC

## 2017-04-30 MED ORDER — LORAZEPAM 1 MG PO TABS
1.0000 mg | ORAL_TABLET | Freq: Once | ORAL | Status: AC
  Administered 2017-04-30: 1 mg via ORAL
  Filled 2017-04-30: qty 1

## 2017-04-30 NOTE — Discharge Instructions (Signed)
Please follow-up with a primary care doctor within the next 1-2 weeks for recheck/reevaluation. Please continue to monitor blood pressure at home. If your blood pressure drops below 120/80 please discontinue use of metoprolol. Return to the emergency department for any chest pain, trouble breathing, or any other symptom personally concerning to yourself.

## 2017-04-30 NOTE — ED Provider Notes (Signed)
[facility] Emergency Department Provider Note  Time seen: 12:41 AM  I have reviewed the triage vital signs and the nursing notes.   HISTORY  Chief Complaint Hypertension    HPI Logan Lamb is a 36 y.o. male with a past medical history of hypertension who presents to the emergency department for hypertension. According to the patient he is prescribed lisinopril 10 mg daily for the past 5 months. He states over the past 3 days he has not been feeling right. He states he has been feeling jerking sensations in his body as well as a headache. [facility] and checked his blood pressure was elevated partially 150/110. He states he continued not to feel well today he returned [facility] to check his blood pressure and it was 180/120 so he came to the emergency department for evaluation. Patient states mild headache currently although much improved since taking Tylenol earlier today. Denies any chest pain or shortness of breath. Denies any abdominal pain nausea vomiting diarrhea. Denies any history of anxiety in the past. Patient states he was having difficulty sleeping last night because of the feeling of something being wrong in his body which she describes as a jerking sensation at times.  Past Medical History:  Diagnosis Date  . Hypertension     There are no active problems to display for this patient.   History reviewed. No pertinent surgical history.  Prior to Admission medications   Medication Sig Start Date End Date Taking? Authorizing Provider  carisoprodol (SOMA) 350 MG tablet Take 1 tablet (350 mg total) by mouth 3 (three) times daily as needed for muscle spasms. 07/29/16 07/29/17  Myrna BlazerSchaevitz, David Matthew, MD  esomeprazole (NEXIUM) 40 MG capsule Take 40 mg by mouth daily before breakfast.    [provider]  lisinopril (PRINIVIL,ZESTRIL) 10 MG tablet Take 1 tablet (10 mg total) by mouth daily. 03/01/17 03/01/18  Rockne MenghiniNorman, Anne-Caroline, MD   oxyCODONE-acetaminophen (ROXICET) 5-325 MG tablet Take 1-2 tablets by mouth every 4 (four) hours as needed for severe pain. 03/26/16   Beers, Charmayne Sheerharles M, PA-C    No Known Allergies  History reviewed. No pertinent family history.  Social History Social History  Substance Use Topics  . Smoking status: Current Every Day Smoker  . Smokeless tobacco: Former NeurosurgeonUser  . Alcohol use Yes    Review of Systems Constitutional: Negative for fever. Eyes: Negative for visual changes. ENT: Negative for congestion Cardiovascular: Negative for chest pain. Respiratory: Negative for shortness of breath. Gastrointestinal: Negative for abdominal pain, vomiting and diarrhea. Musculoskeletal: Negative for leg swelling. Neurological: Moderate headache earlier, mild headache currently. All other ROS negative  ____________________________________________   PHYSICAL EXAM:  VITAL SIGNS: ED Triage Vitals  Enc Vitals Group     BP 04/29/17 2337 (!) 191/123     Pulse Rate 04/29/17 2337 79     Resp 04/29/17 2337 16     Temp 04/29/17 2337 98 F (36.7 C)     Temp Source 04/29/17 2337 Oral     SpO2 04/29/17 2337 99 %     Weight 04/29/17 2339 180 lb (81.6 kg)     Height --      Head Circumference --      Peak Flow --      Pain Score --      Pain Loc --      Pain Edu? --      Excl. in GC? --     Constitutional: Alert and oriented.  Well appearing and in no distress. Eyes: Normal exam ENT   Head: Normocephalic and atraumatic.   Mouth/Throat: Mucous membranes are moist. Cardiovascular: Normal rate, regular rhythm. No murmur Respiratory: Normal respiratory effort without tachypnea nor retractions. Breath sounds are clear  Gastrointestinal: Soft and nontender. No distention.   Musculoskeletal: Nontender with normal range of motion in all extremities. No lower extremity tenderness or edema. Neurologic:  Normal speech and language. No gross focal neurologic deficits  Skin:  Skin is warm, dry and  intact.  Psychiatric: Mood and affect are normal. Speech and behavior are normal.   ____________________________________________    EKG  EKG reviewed and interpreted by myself shows normal sinus rhythm at 63 bpm, normal QRS, normal axis, large normal intervals with nonspecific ST changes but no ST elevation.  ____________________________________________   INITIAL IMPRESSION / ASSESSMENT AND PLAN / ED COURSE  Pertinent labs & imaging results that were available during my care of the patient were reviewed by me and considered in my medical decision making (see chart for details).  The patient presents to the emergency department for hypertension any feeling of not feeling well which she describes as a headache and a jerking sensation in his body. Patient does appear mildly anxious during her examination. Physical exam is largely within normal limits. We will check labs, EKG, treat with 1 mg of Ativan orally and continue to monitor in the emergency department.  Patient states he is feeling better after medication. Current blood pressure 167/99. Patient's EKG is reassuring. Labs are normal. Given the patient's persistently elevated blood pressure over I do believe the patient is likely in need of an additional medication in addition to lisinopril. We will start the patient on a low-dose of metoprolol. We'll have the patient follow up with a primary care physician for recheck. Patient agreeable to plan.  ____________________________________________   FINAL CLINICAL IMPRESSION(S) / ED DIAGNOSES  Hypertension    Minna Antis, MD 04/30/17 [phone]

## 2017-04-30 NOTE — ED Notes (Signed)
Patient given d/c instructions. Instructed patient lifestyle changes and to stop smoking. Advised patient to do small changes. Voiced understanding.

## 2017-11-06 ENCOUNTER — Other Ambulatory Visit: Payer: Self-pay

## 2017-11-06 ENCOUNTER — Emergency Department
Admission: EM | Admit: 2017-11-06 | Discharge: 2017-11-06 | Disposition: A | Discharge status: Home / Self Care | Payer: Self-pay | Attending: Emergency Medicine | Admitting: Emergency Medicine

## 2017-11-06 DIAGNOSIS — Z79899 Other long term (current) drug therapy: Secondary | ICD-10-CM | POA: Insufficient documentation

## 2017-11-06 DIAGNOSIS — I10 Essential (primary) hypertension: Secondary | ICD-10-CM | POA: Insufficient documentation

## 2017-11-06 DIAGNOSIS — Z760 Encounter for issue of repeat prescription: Secondary | ICD-10-CM | POA: Insufficient documentation

## 2017-11-06 DIAGNOSIS — R369 Urethral discharge, unspecified: Secondary | ICD-10-CM | POA: Insufficient documentation

## 2017-11-06 DIAGNOSIS — F17210 Nicotine dependence, cigarettes, uncomplicated: Secondary | ICD-10-CM | POA: Insufficient documentation

## 2017-11-06 LAB — URINALYSIS, COMPLETE (UACMP) WITH MICROSCOPIC
BILIRUBIN URINE: NEGATIVE
Bacteria, UA: NONE SEEN
Glucose, UA: NEGATIVE mg/dL
HGB URINE DIPSTICK: NEGATIVE
KETONES UR: NEGATIVE mg/dL
Leukocytes, UA: NEGATIVE
Nitrite: NEGATIVE
PH: 5 (ref 5.0–8.0)
Protein, ur: 30 mg/dL — AB
RBC / HPF: NONE SEEN RBC/hpf (ref 0–5)
Specific Gravity, Urine: 1.027 (ref 1.005–1.030)
WBC UA: NONE SEEN WBC/hpf (ref 0–5)

## 2017-11-06 LAB — CHLAMYDIA/NGC RT PCR (ARMC ONLY)
Chlamydia Tr: NOT DETECTED
N gonorrhoeae: NOT DETECTED

## 2017-11-06 MED ORDER — LISINOPRIL 10 MG PO TABS: 10.0000 mg | ORAL_TABLET | Freq: Every day | ORAL | 1 refills | Status: DC

## 2017-11-06 MED ORDER — METOPROLOL TARTRATE 25 MG PO TABS: 25.0000 mg | ORAL_TABLET | Freq: Two times a day (BID) | ORAL | 1 refills | Status: DC

## 2017-11-06 NOTE — ED Notes (Signed)
Into patient's room to collect urine specimens, patient not in room.

## 2017-11-06 NOTE — ED Provider Notes (Signed)
[facility] Emergency Department Provider Note   ____________________________________________   First MD Initiated Contact with Patient 11/06/17 [id number]     (approximate)  I have reviewed the triage vital signs and the nursing notes.   HISTORY  Chief Complaint Penile Discharge    HPI Logan Lamb is a 36 y.o. male patient awakened this morning with penile discharge and dysuria. Patient state last unprotected sexual contact was last night. Patient had previous STDs 6 months ago. Patient denies testicle and scrotum pain. Patient also requests refill hypertension medication. Past Medical History:  Diagnosis Date  . Hypertension     There are no active problems to display for this patient.   History reviewed. No pertinent surgical history.  Prior to Admission medications   Medication Sig Start Date End Date Taking? Authorizing Provider  esomeprazole (NEXIUM) 40 MG capsule Take 40 mg by mouth daily before breakfast.    [provider]  lisinopril (PRINIVIL,ZESTRIL) 10 MG tablet Take 1 tablet (10 mg total) by mouth daily. 11/06/17 11/06/18  Joni ReiningSmith, Floretta Petro K, PA-C  metoprolol tartrate (LOPRESSOR) 25 MG tablet Take 1 tablet (25 mg total) by mouth 2 (two) times daily. 11/06/17 11/06/18  Joni ReiningSmith, Cedrik Heindl K, PA-C  oxyCODONE-acetaminophen (ROXICET) 5-325 MG tablet Take 1-2 tablets by mouth every 4 (four) hours as needed for severe pain. 03/26/16   Beers, Charmayne Sheerharles M, PA-C    Allergies Patient has no known allergies.  No family history on file.  Social History Social History   Tobacco Use  . Smoking status: Current Every Day Smoker  . Smokeless tobacco: Former Engineer, waterUser  Substance Use Topics  . Alcohol use: Yes  . Drug use: No    Review of Systems Constitutional: No fever/chills Eyes: No visual changes. ENT: No sore throat. Cardiovascular: Denies chest pain. Respiratory: Denies shortness of breath. Gastrointestinal: No abdominal pain.  No nausea, no  vomiting.  No diarrhea.  No constipation. Genitourinary: Negative for dysuria. Positive for urethral discharge Musculoskeletal: Negative for back pain. Skin: Negative for rash. Neurological: Negative for headaches, focal weakness or numbness.   ____________________________________________   PHYSICAL EXAM:  VITAL SIGNS: ED Triage Vitals [11/06/17 0854]  Enc Vitals Group     BP (!) 132/110     Pulse Rate 100     Resp 16     Temp 98.1 F (36.7 C)     Temp Source Oral     SpO2 99 %     Weight 180 lb (81.6 kg)     Height 5\' 5"  (1.651 m)     Head Circumference      Peak Flow      Pain Score      Pain Loc      Pain Edu?      Excl. in GC?     Constitutional: Alert and oriented. Well appearing and in no acute distress. Cardiovascular: Normal rate, regular rhythm. Grossly normal heart sounds.  Good peripheral circulation. Elevated blood pressure. Respiratory: Normal respiratory effort.  No retractions. Lungs CTAB. Gastrointestinal: Soft and nontender. No distention. No abdominal bruits. No CVA tenderness. Genitourinary: Negative for penile lesion or urethral discharge. Musculoskeletal: No lower extremity tenderness nor edema.  No joint effusions. Neurologic:  Normal speech and language. No gross focal neurologic deficits are appreciated. No gait instability. Skin:  Skin is warm, dry and intact. No rash noted. Psychiatric: Mood and affect are normal. Speech and behavior are normal.  ____________________________________________   LABS (all labs ordered are listed, but only  abnormal results are displayed)  Labs Reviewed  URINALYSIS, COMPLETE (UACMP) WITH MICROSCOPIC - Abnormal; Notable for the following components:      Result Value   Color, Urine YELLOW (*)    APPearance TURBID (*)    Protein, ur 30 (*)    Squamous Epithelial / LPF 0-5 (*)    All other components within normal limits  CHLAMYDIA/NGC RT PCR ([facility] ONLY)    ____________________________________________  EKG   ____________________________________________  RADIOLOGY  No results found.  ____________________________________________   PROCEDURES  Procedure(s) performed: None  Procedures  Critical Care performed: No  ____________________________________________   INITIAL IMPRESSION / ASSESSMENT AND PLAN / ED COURSE  As part of my medical decision making, I reviewed the following data within the electronic MEDICAL RECORD NUMBER    Patient presented for 1 day onset of urethral discharges in awakening. Physical exam was unremarkable for urethral discharge. Patient labs are negative for chlamydia and gonorrhea. Discussed lab findings with patient. Patient advised to [facility]. Patient was given one was supplied lisinopril and Lopressor for hypertension. Patient [facility]      ____________________________________________   FINAL CLINICAL IMPRESSION(S) / ED DIAGNOSES  Final diagnoses:  Urethral discharge  Medication refill     ED Discharge Orders        Ordered    lisinopril (PRINIVIL,ZESTRIL) 10 MG tablet  Daily     11/06/17 1215    metoprolol tartrate (LOPRESSOR) 25 MG tablet  2 times daily     11/06/17 1215       Note:  This document was prepared using Dragon voice recognition software and may include unintentional dictation errors.    Joni ReiningSmith, Peg Fifer K, PA-C 11/06/17 1220    Sharyn CreamerQuale, Mark, MD 11/06/17 [phone]

## 2017-11-06 NOTE — ED Notes (Signed)
Patient denies pain and is resting comfortably.  

## 2017-11-06 NOTE — ED Triage Notes (Signed)
Pt c/o having penile discharge today. States 6 months ago had same sx and treated for STD.

## 2017-11-06 NOTE — Discharge Instructions (Signed)
Advised patient that urinalysis and test for chlamydia and gonorrhea were negative. If complaint persists [facility] for [facility] evaluation and testing. Patient is given a refill for hypertension medication.

## 2017-11-06 NOTE — ED Notes (Signed)
Patient returned to room.  AAOx3.  Skin warm and dry. NAD

## 2018-02-25 DIAGNOSIS — Z760 Encounter for issue of repeat prescription: Secondary | ICD-10-CM | POA: Diagnosis not present

## 2018-02-25 DIAGNOSIS — Z720 Tobacco use: Secondary | ICD-10-CM | POA: Diagnosis not present

## 2018-02-25 DIAGNOSIS — I10 Essential (primary) hypertension: Secondary | ICD-10-CM | POA: Diagnosis not present

## 2018-03-04 DIAGNOSIS — I10 Essential (primary) hypertension: Secondary | ICD-10-CM | POA: Insufficient documentation

## 2018-03-04 DIAGNOSIS — Z720 Tobacco use: Secondary | ICD-10-CM | POA: Diagnosis not present

## 2018-03-04 DIAGNOSIS — Z0000 Encounter for general adult medical examination without abnormal findings: Secondary | ICD-10-CM | POA: Diagnosis not present

## 2018-03-05 ENCOUNTER — Emergency Department
Admission: EM | Admit: 2018-03-05 | Discharge: 2018-03-05 | Disposition: A | Discharge status: Home / Self Care | Payer: 59 | Attending: Emergency Medicine | Admitting: Emergency Medicine

## 2018-03-05 ENCOUNTER — Encounter: Payer: Self-pay | Admitting: Emergency Medicine

## 2018-03-05 DIAGNOSIS — Z79899 Other long term (current) drug therapy: Secondary | ICD-10-CM | POA: Insufficient documentation

## 2018-03-05 DIAGNOSIS — F17210 Nicotine dependence, cigarettes, uncomplicated: Secondary | ICD-10-CM | POA: Diagnosis not present

## 2018-03-05 DIAGNOSIS — I10 Essential (primary) hypertension: Secondary | ICD-10-CM | POA: Insufficient documentation

## 2018-03-05 DIAGNOSIS — R519 Headache, unspecified: Secondary | ICD-10-CM

## 2018-03-05 DIAGNOSIS — R51 Headache: Secondary | ICD-10-CM

## 2018-03-05 MED ORDER — CLONIDINE HCL 0.1 MG PO TABS
0.1000 mg | ORAL_TABLET | Freq: Once | ORAL | Status: AC
  Administered 2018-03-05: 0.1 mg via ORAL
  Filled 2018-03-05: qty 1

## 2018-03-05 MED ORDER — ACETAMINOPHEN 325 MG PO TABS
650.0000 mg | ORAL_TABLET | Freq: Once | ORAL | Status: AC
  Administered 2018-03-05: 650 mg via ORAL
  Filled 2018-03-05: qty 2

## 2018-03-05 NOTE — Discharge Instructions (Signed)
You should continue to monitor your blood pressure 1-2 times daily. Sit quietly for at least 15 minutes prior to recording a reading. See your provider as scheduled for further management.

## 2018-03-05 NOTE — ED Provider Notes (Signed)
[facility] Emergency Department Provider Note ____________________________________________  Time seen: 1330  I have reviewed the triage vital signs and the nursing notes.  HISTORY  Chief Complaint  Hypertension  HPI Logan Lamb is a 37 y.o. male presents to the ED accompanied by his wife, for evaluation of elevated blood pressures.  Patient reports a history of high blood pressure that has been controlled to this point, by ED providers.  He describes he recently saw his new PCP yesterday who started him on a higher dose of both his metoprolol and his lisinopril.  He also had routine blood labs drawn at that time but does not have a results today.  The patient expected a more immediate response to his blood pressures following the medication administration.  After about 4 hours following the dose, he repeated his blood pressure and showed it to be in the 180s over 100s.  He and his wife were concerned and he reports to the ED today for further evaluation.  He denies any chest pain, shortness of breath, dizziness, or weakness.  He does report a mild headache he describes as pressure across the forehead.  He had been previously taking over-the-counter anti-inflammatories for relief.  He has since been advised he should avoid dosing NSAIDs for headache pain relief.  Past Medical History:  Diagnosis Date  . Hypertension     There are no active problems to display for this patient.   History reviewed. No pertinent surgical history.  Prior to Admission medications   Medication Sig Start Date End Date Taking? Authorizing Provider  esomeprazole (NEXIUM) 40 MG capsule Take 40 mg by mouth daily before breakfast.    [provider]  lisinopril (PRINIVIL,ZESTRIL) 10 MG tablet Take 1 tablet (10 mg total) by mouth daily. 11/06/17 11/06/18  Joni Reining, PA-C  metoprolol tartrate (LOPRESSOR) 25 MG tablet Take 1 tablet (25 mg total) by mouth 2 (two) times daily.  11/06/17 11/06/18  Joni Reining, PA-C    Allergies Patient has no known allergies.  No family history on file.  Social History Social History   Tobacco Use  . Smoking status: Current Every Day Smoker  . Smokeless tobacco: Former Engineer, water Use Topics  . Alcohol use: Yes  . Drug use: No    Review of Systems  Constitutional: Negative for fever. Eyes: Negative for visual changes. ENT: Negative for sore throat. Cardiovascular: Negative for chest pain. Respiratory: Negative for shortness of breath. Gastrointestinal: Negative for abdominal pain, vomiting and diarrhea. Genitourinary: Negative for dysuria. Musculoskeletal: Negative for back pain. Skin: Negative for rash. Neurological: Negative for focal weakness or numbness.  Mild headache as above. ____________________________________________  PHYSICAL EXAM:  VITAL SIGNS: ED Triage Vitals [03/05/18 1228]  Enc Vitals Group     BP (!) 186/107     Pulse Rate 64     Resp 16     Temp 98.2 F (36.8 C)     Temp Source Oral     SpO2 100 %     Weight 174 lb (78.9 kg)     Height 5\' 5"  (1.651 m)     Head Circumference      Peak Flow      Pain Score 5     Pain Loc      Pain Edu?      Excl. in GC?     Constitutional: Alert and oriented. Well appearing and in no distress. Head: Normocephalic and atraumatic. Eyes: Conjunctivae are normal. PERRL.  Normal extraocular movements Neck: Supple. No thyromegaly. Cardiovascular: Normal rate, regular rhythm. Normal distal pulses.  No murmurs, rubs, or gallops are appreciated. Respiratory: Normal respiratory effort. No wheezes/rales/rhonchi. Musculoskeletal: Nontender with normal range of motion in all extremities.  Neurologic:  Normal gait without ataxia. Normal speech and language. No gross focal neurologic deficits are appreciated. Skin:  Skin is warm, dry and intact. No rash noted. Psychiatric: Mood and affect are normal. Patient exhibits appropriate insight and  judgment. ____________________________________________  PROCEDURES  Procedures Clonidine 0.1 mg PO Tylenol 650 mg PO ____________________________________________  INITIAL IMPRESSION / ASSESSMENT AND PLAN / ED COURSE  Patient with ED evaluation of elevated blood pressures. Review of his chart reveal BPs historically in the 170s/100s here. The patient is further advised that he should not expect a precipitous change in his blood pressure that immediately with less than 24-hour change his blood pressure medications.  He is reassured and is overall normal exam.  We had a discussion about the need to continue to monitor blood pressure readings in the interim for his PCP to help determine if the current dosage is working for him.  Patient is also advised that he should avoid taking NSAIDs and decongestants over-the-counter.  He is given a single dose of clonidine in the ED but is unable to wait longer than 15 minutes to confirm improvement his blood pressure.  He is asking to be discharged so he may attend his daughter's birthday party.  The patient denies any current complaints at this time.  And voices no other concerns.  He will follow-up with his PCP as scheduled next week Wednesday.  Return precautions have been reviewed.  His wife verbalized understanding of the instructions and return precautions.  He will continue to dose medications as prescribed.  And will continue with metoprolol twice daily. ____________________________________________  FINAL CLINICAL IMPRESSION(S) / ED DIAGNOSES  Final diagnoses:  Essential hypertension  Acute nonintractable headache, unspecified headache type      Lissa HoardMenshew, Afnan Emberton V Bacon, PA-C 03/05/18 1717    Minna AntisPaduchowski, Kevin, MD 03/05/18 1859

## 2018-03-05 NOTE — ED Notes (Signed)
See triage note  States he has noticed that his b/p has been elevated for a few days was seen by PCP and had meds increased  But this am noticed that it was still high

## 2018-03-05 NOTE — ED Triage Notes (Signed)
Patient presents to the ED for HTN.  Patient states he was seen by PCP yesterday and they started him on metoprolol and increased his dose of lisinopril.  Patient waited 4 hour post taking medication to check BP and was concerned because it was in the 180s/100s.  Patient states that is what it has been running but he thought with the medication it should be improving.  Patient denies chest pain, dizziness and blurry vision.  Reports slight "head pressure".  No obvious distress at this time.

## 2018-03-11 DIAGNOSIS — I10 Essential (primary) hypertension: Secondary | ICD-10-CM | POA: Diagnosis not present

## 2018-11-02 DIAGNOSIS — R0989 Other specified symptoms and signs involving the circulatory and respiratory systems: Secondary | ICD-10-CM | POA: Diagnosis not present

## 2018-11-02 DIAGNOSIS — J029 Acute pharyngitis, unspecified: Secondary | ICD-10-CM | POA: Diagnosis not present

## 2018-11-09 DIAGNOSIS — H538 Other visual disturbances: Secondary | ICD-10-CM | POA: Diagnosis not present

## 2018-11-09 DIAGNOSIS — I10 Essential (primary) hypertension: Secondary | ICD-10-CM | POA: Diagnosis not present

## 2018-11-10 ENCOUNTER — Emergency Department
Admission: EM | Admit: 2018-11-10 | Discharge: 2018-11-10 | Disposition: A | Discharge status: Home / Self Care | Payer: 59 | Attending: Emergency Medicine | Admitting: Emergency Medicine

## 2018-11-10 ENCOUNTER — Other Ambulatory Visit: Payer: Self-pay

## 2018-11-10 DIAGNOSIS — I10 Essential (primary) hypertension: Secondary | ICD-10-CM | POA: Diagnosis not present

## 2018-11-10 DIAGNOSIS — Z008 Encounter for other general examination: Secondary | ICD-10-CM | POA: Insufficient documentation

## 2018-11-10 DIAGNOSIS — Z5321 Procedure and treatment not carried out due to patient leaving prior to being seen by health care provider: Secondary | ICD-10-CM | POA: Insufficient documentation

## 2018-11-10 NOTE — ED Notes (Signed)
Pt declined to be seen, only wanted his blood pressure checked.

## 2018-11-10 NOTE — ED Triage Notes (Addendum)
Pt reports he has a hx of HTN - his PCP increased medication yesterday and he wants to know if it is working - pt has strong smell of alcohol about his person

## 2019-02-06 ENCOUNTER — Emergency Department: Payer: 59

## 2019-02-06 ENCOUNTER — Emergency Department
Admission: EM | Admit: 2019-02-06 | Discharge: 2019-02-06 | Disposition: A | Discharge status: Home / Self Care | Payer: 59 | Attending: Emergency Medicine | Admitting: Emergency Medicine

## 2019-02-06 ENCOUNTER — Other Ambulatory Visit: Payer: Self-pay

## 2019-02-06 ENCOUNTER — Encounter: Payer: Self-pay | Admitting: Emergency Medicine

## 2019-02-06 DIAGNOSIS — M79671 Pain in right foot: Secondary | ICD-10-CM

## 2019-02-06 DIAGNOSIS — F17210 Nicotine dependence, cigarettes, uncomplicated: Secondary | ICD-10-CM | POA: Insufficient documentation

## 2019-02-06 DIAGNOSIS — I10 Essential (primary) hypertension: Secondary | ICD-10-CM | POA: Insufficient documentation

## 2019-02-06 DIAGNOSIS — Z79899 Other long term (current) drug therapy: Secondary | ICD-10-CM | POA: Diagnosis not present

## 2019-02-06 MED ORDER — MELOXICAM 7.5 MG PO TABS: 7.5000 mg | ORAL_TABLET | Freq: Every day | ORAL | 0 refills | Status: AC

## 2019-02-06 NOTE — ED Triage Notes (Signed)
Pt c/o R foot pain since Thurday night. Pt states he either dropped something on it or bumped it. Has been soaking it without relief. Pt ambulatory with slight limp.

## 2019-02-06 NOTE — Discharge Instructions (Addendum)
Follow-up with your primary care provider if any continued problems.  Begin taking meloxicam once daily with food.  You may use ice or heat to your foot as needed for discomfort.  Wear supportive shoes.

## 2019-02-06 NOTE — ED Provider Notes (Signed)
[facility] Emergency Department Provider Note  ____________________________________________   First MD Initiated Contact with Patient 02/06/19 [phone]     (approximate)  I have reviewed the triage vital signs and the nursing notes.   HISTORY  Chief Complaint Foot Pain   HPI Logan Lamb is a 38 y.o. male presents to the ED with complaint of right foot pain that began 4 nights ago.  Patient states that he "either dropped something on it or bumped it".  Patient has been soaking it in warm water without any relief.  He states he has taken aspirin once without any relief.  He denies any previous injury.  Currently rates his pain as an 8 out of 10.     Past Medical History:  Diagnosis Date  . Hypertension     There are no active problems to display for this patient.   History reviewed. No pertinent surgical history.  Prior to Admission medications   Medication Sig Start Date End Date Taking? Authorizing Provider  esomeprazole (NEXIUM) 40 MG capsule Take 40 mg by mouth daily before breakfast.    [provider]  lisinopril (PRINIVIL,ZESTRIL) 10 MG tablet Take 1 tablet (10 mg total) by mouth daily. 11/06/17 11/06/18  Joni Reining, PA-C  meloxicam (MOBIC) 7.5 MG tablet Take 1 tablet (7.5 mg total) by mouth daily. 02/06/19 02/06/20  Tommi Rumps, PA-C  metoprolol tartrate (LOPRESSOR) 25 MG tablet Take 1 tablet (25 mg total) by mouth 2 (two) times daily. 11/06/17 11/06/18  Joni Reining, PA-C    Allergies Patient has no known allergies.  History reviewed. No pertinent family history.  Social History Social History   Tobacco Use  . Smoking status: Current Every Day Smoker    Packs/day: 0.50    Types: Cigarettes  . Smokeless tobacco: Former Engineer, water Use Topics  . Alcohol use: Yes    Comment: x3 week  . Drug use: No    Review of Systems Constitutional: No fever/chills Eyes: No visual changes. ENT: No sore  throat. Cardiovascular: Denies chest pain. Respiratory: Denies shortness of breath. Musculoskeletal: Positive for right foot pain. Skin: Negative for rash. Neurological: Negative for headaches, focal weakness or numbness. ____________________________________________   PHYSICAL EXAM:  VITAL SIGNS: ED Triage Vitals  Enc Vitals Group     BP 02/06/19 0816 (!) 151/96     Pulse Rate 02/06/19 0816 80     Resp 02/06/19 0816 20     Temp 02/06/19 0816 98.7 F (37.1 C)     Temp Source 02/06/19 0816 Oral     SpO2 02/06/19 0816 100 %     Weight 02/06/19 0817 180 lb (81.6 kg)     Height 02/06/19 0817 5\' 5"  (1.651 m)     Head Circumference --      Peak Flow --      Pain Score 02/06/19 0817 8     Pain Loc --      Pain Edu? --      Excl. in GC? --    Constitutional: Alert and oriented. Well appearing and in no acute distress. Eyes: Conjunctivae are normal.  Head: Atraumatic. Neck: No stridor.   Cardiovascular: Normal rate, regular rhythm. Grossly normal heart sounds.  Good peripheral circulation. Respiratory: Normal respiratory effort.  No retractions. Lungs CTAB. Musculoskeletal: On examination of the right foot there is no gross deformity or skin discoloration.  No point tenderness and no soft tissue edema is present.  Pulses present.  Generalized  tenderness is noted to the great toe.  Range of motion is without restriction.  Capillary refill is less than 3 seconds. Neurologic:  Normal speech and language. No gross focal neurologic deficits are appreciated. Skin:  Skin is warm, dry and intact. No rash noted. Psychiatric: Mood and affect are normal. Speech and behavior are normal.  ____________________________________________   LABS (all labs ordered are listed, but only abnormal results are displayed)  Labs Reviewed - No data to display  RADIOLOGY  ED MD interpretation:  Right foot x-ray is negative for acute bony injury.  Official radiology report(s): Dg Foot Complete  Right  Result Date: 02/06/2019 CLINICAL DATA:  Right forefoot pain for several days. No known injury. EXAM: RIGHT FOOT COMPLETE - 3+ VIEW COMPARISON:  None. FINDINGS: There is no evidence of fracture or dislocation. There is no evidence of arthropathy or other focal bone abnormality. Soft tissues are unremarkable. IMPRESSION: Negative. Electronically Signed   By: Myles Rosenthal M.D.   On: 02/06/2019 09:01   ____________________________________________   PROCEDURES  Procedure(s) performed (including Critical Care):  Procedures   ____________________________________________   INITIAL IMPRESSION / ASSESSMENT AND PLAN / ED COURSE  As part of my medical decision making, I reviewed the following data within the electronic MEDICAL RECORD NUMBER Notes from prior ED visits and [place] Controlled Substance Database  38 year old male presents to the ED with complaint of right foot pain without known history of injury.  Patient states that 4 days ago he began having pain and thought he may have dropped something on his great toe.  He has been soaking his foot and has taken aspirin once without any relief.  Physical exam was unremarkable.  X-rays were negative.  Patient was given a prescription for meloxicam 7.5 mg 1 daily.  He is encouraged to continue taking his regular medication as prescribed by his doctor and to follow-up with his PCP if any continued problems. ____________________________________________   FINAL CLINICAL IMPRESSION(S) / ED DIAGNOSES  Final diagnoses:  Acute foot pain, right     ED Discharge Orders         Ordered    meloxicam (MOBIC) 7.5 MG tablet  Daily     02/06/19 1610           Note:  This document was prepared using Dragon voice recognition software and may include unintentional dictation errors.    Tommi Rumps, PA-C 02/06/19 1047    Arnaldo Natal, MD 02/06/19 [phone]

## 2020-05-21 ENCOUNTER — Observation Stay
Admission: EM | Admit: 2020-05-21 | Discharge: 2020-05-22 | Disposition: A | Discharge status: Home / Self Care | Payer: Medicaid Other | Attending: Surgery | Admitting: Surgery

## 2020-05-21 ENCOUNTER — Emergency Department: Payer: Medicaid Other

## 2020-05-21 ENCOUNTER — Encounter: Payer: Self-pay | Admitting: Emergency Medicine

## 2020-05-21 ENCOUNTER — Other Ambulatory Visit: Payer: Self-pay

## 2020-05-21 DIAGNOSIS — Z20822 Contact with and (suspected) exposure to covid-19: Secondary | ICD-10-CM | POA: Insufficient documentation

## 2020-05-21 DIAGNOSIS — F17210 Nicotine dependence, cigarettes, uncomplicated: Secondary | ICD-10-CM | POA: Insufficient documentation

## 2020-05-21 DIAGNOSIS — R1030 Lower abdominal pain, unspecified: Secondary | ICD-10-CM

## 2020-05-21 DIAGNOSIS — K3580 Unspecified acute appendicitis: Secondary | ICD-10-CM | POA: Diagnosis not present

## 2020-05-21 DIAGNOSIS — I10 Essential (primary) hypertension: Secondary | ICD-10-CM | POA: Insufficient documentation

## 2020-05-21 DIAGNOSIS — Z79899 Other long term (current) drug therapy: Secondary | ICD-10-CM | POA: Insufficient documentation

## 2020-05-21 DIAGNOSIS — K352 Acute appendicitis with generalized peritonitis, without abscess: Secondary | ICD-10-CM

## 2020-05-21 LAB — CBC
HCT: 46.1 % (ref 39.0–52.0)
Hemoglobin: 16.5 g/dL (ref 13.0–17.0)
MCH: 32.2 pg (ref 26.0–34.0)
MCHC: 35.8 g/dL (ref 30.0–36.0)
MCV: 89.9 fL (ref 80.0–100.0)
Platelets: 255 10*3/uL (ref 150–400)
RBC: 5.13 MIL/uL (ref 4.22–5.81)
RDW: 12.5 % (ref 11.5–15.5)
WBC: 14.2 10*3/uL — ABNORMAL HIGH (ref 4.0–10.5)
nRBC: 0 % (ref 0.0–0.2)

## 2020-05-21 LAB — COMPREHENSIVE METABOLIC PANEL
ALT: 24 U/L (ref 0–44)
AST: 29 U/L (ref 15–41)
Albumin: 4.3 g/dL (ref 3.5–5.0)
Alkaline Phosphatase: 70 U/L (ref 38–126)
Anion gap: 12 (ref 5–15)
BUN: 13 mg/dL (ref 6–20)
CO2: 19 mmol/L — ABNORMAL LOW (ref 22–32)
Calcium: 9.3 mg/dL (ref 8.9–10.3)
Chloride: 104 mmol/L (ref 98–111)
Creatinine, Ser: 1.05 mg/dL (ref 0.61–1.24)
GFR calc Af Amer: >60 mL/min (ref >60)
GFR calc non Af Amer: >60 mL/min (ref >60)
Glucose, Bld: 129 mg/dL — ABNORMAL HIGH (ref 70–99)
Potassium: 3.5 mmol/L (ref 3.5–5.1)
Sodium: 135 mmol/L (ref 135–145)
Total Bilirubin: 1.3 mg/dL — ABNORMAL HIGH (ref 0.3–1.2)
Total Protein: 7.6 g/dL (ref 6.5–8.1)

## 2020-05-21 LAB — URINALYSIS, COMPLETE (UACMP) WITH MICROSCOPIC
Bacteria, UA: NONE SEEN
Bilirubin Urine: NEGATIVE
Glucose, UA: NEGATIVE mg/dL
Ketones, ur: NEGATIVE mg/dL
Leukocytes,Ua: NEGATIVE
Nitrite: NEGATIVE
Protein, ur: NEGATIVE mg/dL
Specific Gravity, Urine: 1.024 (ref 1.005–1.030)
pH: 7 (ref 5.0–8.0)

## 2020-05-21 LAB — LIPASE, BLOOD: Lipase: 22 U/L (ref 11–51)

## 2020-05-21 MED ORDER — ONDANSETRON 4 MG PO TBDP: 4.0000 mg | ORAL_TABLET | Freq: Four times a day (QID) | ORAL | Status: DC | PRN

## 2020-05-21 MED ORDER — ONDANSETRON HCL 4 MG/2ML IJ SOLN: 4.0000 mg | Freq: Four times a day (QID) | INTRAMUSCULAR | Status: DC | PRN

## 2020-05-21 MED ORDER — PROMETHAZINE HCL 25 MG/ML IJ SOLN
25.0000 mg | Freq: Once | INTRAMUSCULAR | Status: AC
  Administered 2020-05-21: 25 mg via INTRAVENOUS

## 2020-05-21 MED ORDER — LISINOPRIL 10 MG PO TABS: 10.0000 mg | ORAL_TABLET | Freq: Every day | ORAL | Status: DC

## 2020-05-21 MED ORDER — MORPHINE SULFATE (PF) 2 MG/ML IV SOLN
2.0000 mg | INTRAVENOUS | Status: DC | PRN
  Administered 2020-05-22 (×2): 2 mg via INTRAVENOUS
  Filled 2020-05-21 (×2): qty 1

## 2020-05-21 MED ORDER — SODIUM CHLORIDE 0.9% FLUSH
3.0000 mL | Freq: Once | INTRAVENOUS | Status: AC
  Administered 2020-05-21: 3 mL via INTRAVENOUS

## 2020-05-21 MED ORDER — PIPERACILLIN-TAZOBACTAM 3.375 G IVPB 30 MIN
3.3750 g | Freq: Once | INTRAVENOUS | Status: AC
  Administered 2020-05-21: 3.375 g via INTRAVENOUS
  Filled 2020-05-21: qty 50

## 2020-05-21 MED ORDER — PANTOPRAZOLE SODIUM 40 MG PO PACK
40.0000 mg | PACK | Freq: Every day | ORAL | Status: DC
  Filled 2020-05-21: qty 20

## 2020-05-21 MED ORDER — MORPHINE SULFATE (PF) 4 MG/ML IV SOLN
4.0000 mg | Freq: Once | INTRAVENOUS | Status: AC
  Administered 2020-05-21: 4 mg via INTRAVENOUS
  Filled 2020-05-21: qty 1

## 2020-05-21 MED ORDER — SODIUM CHLORIDE 0.9 % IV BOLUS
1000.0000 mL | Freq: Once | INTRAVENOUS | Status: AC
  Administered 2020-05-21: 1000 mL via INTRAVENOUS

## 2020-05-21 MED ORDER — ONDANSETRON HCL 4 MG/2ML IJ SOLN
4.0000 mg | Freq: Once | INTRAMUSCULAR | Status: AC
  Administered 2020-05-21: 4 mg via INTRAVENOUS
  Filled 2020-05-21: qty 2

## 2020-05-21 MED ORDER — OXYCODONE-ACETAMINOPHEN 5-325 MG PO TABS
1.0000 | ORAL_TABLET | ORAL | Status: AC | PRN
  Administered 2020-05-21 – 2020-05-22 (×2): 1 via ORAL
  Filled 2020-05-21 (×2): qty 1

## 2020-05-21 MED ORDER — ZOLPIDEM TARTRATE 5 MG PO TABS
5.0000 mg | ORAL_TABLET | Freq: Every evening | ORAL | Status: DC | PRN
  Administered 2020-05-22: 5 mg via ORAL
  Filled 2020-05-21: qty 1

## 2020-05-21 MED ORDER — IOHEXOL 300 MG/ML  SOLN
100.0000 mL | Freq: Once | INTRAMUSCULAR | Status: AC | PRN
  Administered 2020-05-21: 100 mL via INTRAVENOUS
  Filled 2020-05-21: qty 100

## 2020-05-21 MED ORDER — HYDROMORPHONE HCL 1 MG/ML IJ SOLN
1.0000 mg | Freq: Once | INTRAMUSCULAR | Status: AC
  Administered 2020-05-21: 1 mg via INTRAVENOUS
  Filled 2020-05-21: qty 1

## 2020-05-21 MED ORDER — SODIUM CHLORIDE 0.9 % IV SOLN
INTRAVENOUS | Status: DC
  Administered 2020-05-22: 125 mL/h via INTRAVENOUS

## 2020-05-21 MED ORDER — METOPROLOL TARTRATE 25 MG PO TABS
25.0000 mg | ORAL_TABLET | Freq: Two times a day (BID) | ORAL | Status: DC
  Administered 2020-05-22 (×2): 25 mg via ORAL
  Filled 2020-05-21 (×2): qty 1

## 2020-05-21 NOTE — ED Notes (Signed)
Pt ambulated into lobby without difficulty. Pt then walks to first nurse desk to check in. Pt gets down on all 4's in lobby. Offered wheelchair, pt picks himself off and into wheelchair on his own. Pt wheeled to waiting to be triaged area, pt then crawls out of wheelchair and gets back down on all 4's moaning in pain. Nurse asked pt to get into wheelchair, pt stood himself up and got into wheelchair.

## 2020-05-21 NOTE — ED Triage Notes (Signed)
Pt via pov from home with abdominal pain that radiates to groin x 7 hours. Pt endorses nausea, denies vomiting. Pt is writhing and moaning during triage. Pt alert & oriented, nad noted.

## 2020-05-21 NOTE — ED Notes (Signed)
Pt wife states she can drive him home if he calls.

## 2020-05-21 NOTE — ED Notes (Signed)
Pt laying on floor in subwait. Connye Burkitt, RN aware.

## 2020-05-21 NOTE — ED Provider Notes (Signed)
[facility] Emergency Department Provider Note  ____________________________________________  Time seen: Approximately 7:47 PM  I have reviewed the triage vital signs and the nursing notes.   HISTORY  Chief Complaint Abdominal Pain and Groin Pain    HPI Logan Lamb is a 39 y.o. male who presents the emergency department complaining of sharp, significant right lower quadrant pain.  Patient states that the pain started today, has been present for  roughly 10 hours.  Patient is moaning, holding onto his abdomen.  Pain is is in the right lower quadrant radiating to his right groin.  Patient denies any dysuria, polyuria, hematuria.  No history of nephrolithiasis.  Patient still has his appendix and gallbladder.  No fevers or chills.  Patient is unable to eat at this time.        Past Medical History:  Diagnosis Date  . Hypertension     There are no problems to display for this patient.   History reviewed. No pertinent surgical history.  Prior to Admission medications   Medication Sig Start Date End Date Taking? Authorizing Provider  aluminum-magnesium hydroxide-simethicone (MAALOX) 200-200-20 MG/5ML SUSP Take 30 mLs by mouth 4 (four) times daily -  before meals and at bedtime.   Yes [provider]  amLODipine (NORVASC) 5 MG tablet Take 5 mg by mouth daily. 04/18/20  Yes [provider]  lisinopril (ZESTRIL) 20 MG tablet Take 20 mg by mouth daily. 01/12/20  Yes [provider]  metoprolol tartrate (LOPRESSOR) 25 MG tablet Take 1 tablet (25 mg total) by mouth 2 (two) times daily. 11/06/17 05/21/20 Yes Joni Reining, PA-C    Allergies Patient has no known allergies.  History reviewed. No pertinent family history.  Social History Social History   Tobacco Use  . Smoking status: Current Every Day Smoker    Packs/day: 0.75    Types: Cigarettes  . Smokeless tobacco: Former Engineer, water Use Topics  . Alcohol use: Yes     Comment: daily  . Drug use: No     Review of Systems  Constitutional: No fever/chills Eyes: No visual changes. No discharge ENT: No upper respiratory complaints. Cardiovascular: no chest pain. Respiratory: no cough. No SOB. Gastrointestinal: RLQ abdominal pain. positive nausea, no vomiting.  No diarrhea.  No constipation. Genitourinary: Negative for dysuria. No hematuria Musculoskeletal: Negative for musculoskeletal pain. Skin: Negative for rash, abrasions, lacerations, ecchymosis. Neurological: Negative for headaches, focal weakness or numbness. 10-point ROS otherwise negative.  ____________________________________________   PHYSICAL EXAM:  VITAL SIGNS: ED Triage Vitals  Enc Vitals Group     BP 05/21/20 1731 (!) 132/92     Pulse Rate 05/21/20 1731 (!) 101     Resp 05/21/20 1731 18     Temp 05/21/20 1731 98.4 F (36.9 C)     Temp Source 05/21/20 1731 Oral     SpO2 05/21/20 1731 99 %     Weight --      Height --      Head Circumference --      Peak Flow --      Pain Score 05/21/20 1940 10     Pain Loc --      Pain Edu? --      Excl. in GC? --      Constitutional: Alert and oriented. Well appearing and in no acute distress. Eyes: Conjunctivae are normal. PERRL. EOMI. Head: Atraumatic. ENT:      Ears:       Nose: No congestion/rhinnorhea.  Mouth/Throat: Mucous membranes are moist.  Neck: No stridor.    Cardiovascular: Normal rate, regular rhythm. Normal S1 and S2.  Good peripheral circulation. Respiratory: Normal respiratory effort without tachypnea or retractions. Lungs CTAB. Good air entry to the bases with no decreased or absent breath sounds. Gastrointestinal: Bowel sounds 4 quadrants.  Soft to palpation.  Patient is exquisitely tender to palpation in the right lower quadrant.  Guarding, writhing and withdrawing to palpation.  Positive for rebound tenderness.  Positive Rovsing's.  No palpable masses. No distention. No CVA tenderness. Musculoskeletal: Full  range of motion to all extremities. No gross deformities appreciated. Neurologic:  Normal speech and language. No gross focal neurologic deficits are appreciated.  Skin:  Skin is warm, dry and intact. No rash noted. Psychiatric: Mood and affect are normal. Speech and behavior are normal. Patient exhibits appropriate insight and judgement.   ____________________________________________   LABS (all labs ordered are listed, but only abnormal results are displayed)  Labs Reviewed  COMPREHENSIVE METABOLIC PANEL - Abnormal; Notable for the following components:      Result Value   CO2 19 (*)    Glucose, Bld 129 (*)    Total Bilirubin 1.3 (*)    All other components within normal limits  CBC - Abnormal; Notable for the following components:   WBC 14.2 (*)    All other components within normal limits  URINALYSIS, COMPLETE (UACMP) WITH MICROSCOPIC - Abnormal; Notable for the following components:   Color, Urine YELLOW (*)    APPearance CLEAR (*)    Hgb urine dipstick SMALL (*)    All other components within normal limits  SARS CORONAVIRUS 2 BY RT PCR (HOSPITAL ORDER, PERFORMED IN [place] [facility])  LIPASE, BLOOD   ____________________________________________  EKG   ____________________________________________  RADIOLOGY I personally viewed and evaluated these images as part of my medical decision making, as well as reviewing the written report by the radiologist.  CT ABDOMEN PELVIS W CONTRAST  Result Date: 05/21/2020 CLINICAL DATA:  Acute right lower quadrant abdominal pain. EXAM: CT ABDOMEN AND PELVIS WITH CONTRAST TECHNIQUE: Multidetector CT imaging of the abdomen and pelvis was performed using the standard protocol following bolus administration of intravenous contrast. CONTRAST:  OMNIPAQUE IOHEXOL 300 MG/ML  SOLN COMPARISON:  December 09, 2014. FINDINGS: Lower chest: No acute abnormality. Hepatobiliary: No focal liver abnormality is seen. No gallstones, gallbladder  wall thickening, or biliary dilatation. Pancreas: Unremarkable. No pancreatic ductal dilatation or surrounding inflammatory changes. Spleen: Normal in size without focal abnormality. Adrenals/Urinary Tract: Adrenal glands are unremarkable. Kidneys are normal, without renal calculi, focal lesion, or hydronephrosis. Bladder is unremarkable. Stomach/Bowel: The stomach appears normal. There is no evidence of bowel obstruction. The appendix is enlarged with surrounding inflammation consistent with acute appendicitis. It measures 12 mm in maximum diameter. Large amount of inflammatory stranding and probable fluid is noted around the appendix. The the appendix is very ill-defined in its margins, and perforation cannot be excluded. No definite defined fluid collection is noted. Vascular/Lymphatic: No significant vascular findings are present. No enlarged abdominal or pelvic lymph nodes. Reproductive: Prostate is unremarkable. Other: No abdominal wall hernia or abnormality. No abdominopelvic ascites. Musculoskeletal: No acute or significant osseous findings. IMPRESSION: Findings consistent with acute appendicitis. The appendix is very ill-defined in its margins with a large amount of surrounding inflammation and some fluid, and perforation cannot be excluded. No definite well-circumscribed or well-defined fluid collection is noted. Electronically Signed   By: Lupita Raider M.D.   On:  05/21/2020 20:50   US SCROTUM W/DOPPLER  Result Date: 05/21/2020 CLINICAL DATA:  Groin and lower abdominal pain EXAM: SCROTAL ULTRASOUND DOPPLER ULTRASOUND OF THE TESTICLES TECHNIQUE: Complete ultrasound examination of the testicles, epididymis, and other scrotal structures was performed. Color and spectral Doppler ultrasound were also utilized to evaluate blood flow to the testicles. COMPARISON:  None. FINDINGS: Right testicle Measurements: 4.3 x 2.0 x 3.1 cm. No mass or microlithiasis visualized. Left testicle Measurements: 4.3 x 1.9 x  3.2 cm. No mass or microlithiasis visualized. Right epididymis:  5 mm epididymal head cyst Left epididymis:  5 mm epididymal head cyst. Hydrocele:  None visualized. Varicocele:  Mild left varicocele. 4 mm scrotal pearl noted within the left scrotum. Pulsed Doppler interrogation of both testes demonstrates normal low resistance arterial and venous waveforms bilaterally. IMPRESSION: No testicular abnormality or evidence of torsion. Mild left hydrocele. Electronically Signed   By: Charlett Nose M.D.   On: 05/21/2020 19:10    ____________________________________________    PROCEDURES  Procedure(s) performed:    Procedures    Medications  oxyCODONE-acetaminophen (PERCOCET/ROXICET) 5-325 MG per tablet 1 tablet (1 tablet Oral Given 05/21/20 1813)  sodium chloride flush (NS) 0.9 % injection 3 mL (3 mLs Intravenous Given 05/21/20 2010)  morphine 4 MG/ML injection 4 mg (4 mg Intravenous Given 05/21/20 2009)  sodium chloride 0.9 % bolus 1,000 mL (0 mLs Intravenous Stopped 05/21/20 2228)  ondansetron (ZOFRAN) injection 4 mg (4 mg Intravenous Given 05/21/20 2008)  iohexol (OMNIPAQUE) 300 MG/ML solution 100 mL (100 mLs Intravenous Contrast Given 05/21/20 2028)  promethazine (PHENERGAN) injection 25 mg (25 mg Intravenous Given 05/21/20 2208)  HYDROmorphone (DILAUDID) injection 1 mg (1 mg Intravenous Given 05/21/20 2207)  piperacillin-tazobactam (ZOSYN) IVPB 3.375 g (3.375 g Intravenous New Bag/Given 05/21/20 2247)     ____________________________________________   INITIAL IMPRESSION / ASSESSMENT AND PLAN / ED COURSE  Pertinent labs & imaging results that were available during my care of the patient were reviewed by me and considered in my medical decision making (see chart for details).  Review of the [place] CSRS was performed in accordance of the [facility] prior to dispensing any controlled drugs.           Patient's diagnosis is consistent with appendicitis.  Patient presented to the emergency department  complaining of sharp right lower quadrant abdominal pain radiating into the groin.  Patient states that pain began very suddenly.  It is quite severe at this time.  Patient has been in agony, writhing while in the emergency department complaining of abdominal pain.  Because of the complaints of extension into the groin, patient had labs, ultrasound of the scrotum prior to my assessment.  No evidence of torsion.  Patient does have a hydrocele.  Given the patient's pain, it appears that pain is referred from the abdomen to the groin.  As hydrocele can be a secondary complication of appendiceal torsion or infection, as well as patient's severe right lower quadrant pain with rebound tenderness and Rovsing's,  I was more concerned for appendicitis and the patient was imaged with CT scan.  Patient has acute appendicitis with possible appendiceal rupture.  I consulted surgeon who agrees to admit the patient.  Patient care was transferred to the surgical service at this time.  Patient has been given pain meds, fluids, Zosyn here in the emergency department..     ____________________________________________  FINAL CLINICAL IMPRESSION(S) / ED DIAGNOSES  Final diagnoses:  Acute appendicitis with generalized peritonitis, unspecified whether abscess present, unspecified whether  gangrene present, unspecified whether perforation present      NEW MEDICATIONS STARTED DURING THIS VISIT:  ED Discharge Orders    None          This chart was dictated using voice recognition software/Dragon. Despite best efforts to proofread, errors can occur which can change the meaning. Any change was purely unintentional.    Darletta Moll, PA-C 05/21/20 2323    Blake Divine, MD 05/22/20 1521

## 2020-05-21 NOTE — ED Notes (Signed)
Pt requesting pain medication and water to drink at this time.

## 2020-05-21 NOTE — ED Notes (Signed)
Rainbow sent to lab

## 2020-05-21 NOTE — ED Notes (Signed)
Pt continues to moan and writhe and leave the subwait area. He was found in the middle of the hallway on the floor. Pt initially refused to get off the floor, but finally got up when requested to do so. Spoke with Dr. Erma Heritage, requested order for [place] for possible testicular torsion and received verbal order.

## 2020-05-21 NOTE — ED Notes (Signed)
Pt covid swabbed at this time

## 2020-05-21 NOTE — ED Notes (Signed)
Pt in floor of subwait moaning in pain. Requested for patient to stand up and sit in his recliner so we can provide care for him. Pt cursing at RN stating "don't you think I would get up if it was easy". Pt states he needs pain medication. Pt drove here, when asked if he could get a ride here, he states "why does it matter?". Explained that if we gave patient medication that could inhibit his ability to drive, states he can get someone. Pt irritable with RN and raises voice at [employer]. Pt stood up without difficulty and walked to recliner.

## 2020-05-22 ENCOUNTER — Observation Stay: Payer: Medicaid Other | Admitting: Certified Registered"

## 2020-05-22 ENCOUNTER — Encounter: Payer: Self-pay | Admitting: Surgery

## 2020-05-22 ENCOUNTER — Encounter
Admission: EM | Disposition: A | Discharge status: Home / Self Care | Payer: Self-pay | Source: Home / Self Care | Attending: Emergency Medicine

## 2020-05-22 DIAGNOSIS — K3580 Unspecified acute appendicitis: Secondary | ICD-10-CM

## 2020-05-22 HISTORY — PX: LAPAROSCOPIC APPENDECTOMY: SHX408

## 2020-05-22 LAB — SARS CORONAVIRUS 2 BY RT PCR (HOSPITAL ORDER, PERFORMED IN ~~LOC~~ HOSPITAL LAB): SARS Coronavirus 2: NEGATIVE

## 2020-05-22 LAB — HIV ANTIBODY (ROUTINE TESTING W REFLEX): HIV Screen 4th Generation wRfx: NONREACTIVE

## 2020-05-22 SURGERY — APPENDECTOMY, LAPAROSCOPIC
Anesthesia: General

## 2020-05-22 MED ORDER — ROCURONIUM BROMIDE 10 MG/ML (PF) SYRINGE
PREFILLED_SYRINGE | INTRAVENOUS | Status: AC
  Filled 2020-05-22: qty 10

## 2020-05-22 MED ORDER — LIDOCAINE HCL (CARDIAC) PF 100 MG/5ML IV SOSY
PREFILLED_SYRINGE | INTRAVENOUS | Status: DC | PRN
  Administered 2020-05-22: 50 mg via INTRAVENOUS

## 2020-05-22 MED ORDER — SUGAMMADEX SODIUM 200 MG/2ML IV SOLN
INTRAVENOUS | Status: DC | PRN
  Administered 2020-05-22: 50 mg via INTRAVENOUS
  Administered 2020-05-22: 150 mg via INTRAVENOUS

## 2020-05-22 MED ORDER — IBUPROFEN 800 MG PO TABS: 800.0000 mg | ORAL_TABLET | Freq: Three times a day (TID) | ORAL | 0 refills | Status: DC | PRN

## 2020-05-22 MED ORDER — AMOXICILLIN-POT CLAVULANATE 875-125 MG PO TABS
1.0000 | ORAL_TABLET | Freq: Two times a day (BID) | ORAL | 0 refills | Status: AC
Start: 2020-05-22 — End: 2020-05-29

## 2020-05-22 MED ORDER — DEXMEDETOMIDINE HCL 200 MCG/2ML IV SOLN
INTRAVENOUS | Status: DC | PRN
  Administered 2020-05-22 (×2): 4 ug via INTRAVENOUS

## 2020-05-22 MED ORDER — ONDANSETRON HCL 4 MG/2ML IJ SOLN
INTRAMUSCULAR | Status: AC
  Filled 2020-05-22: qty 2

## 2020-05-22 MED ORDER — DEXAMETHASONE SODIUM PHOSPHATE 10 MG/ML IJ SOLN
INTRAMUSCULAR | Status: DC | PRN
  Administered 2020-05-22: 5 mg via INTRAVENOUS

## 2020-05-22 MED ORDER — PIPERACILLIN-TAZOBACTAM 3.375 G IVPB
3.3750 g | Freq: Three times a day (TID) | INTRAVENOUS | Status: DC
  Administered 2020-05-22: 3.375 g via INTRAVENOUS

## 2020-05-22 MED ORDER — HYDROCODONE-ACETAMINOPHEN 5-325 MG PO TABS
1.0000 | ORAL_TABLET | Freq: Four times a day (QID) | ORAL | 0 refills | Status: DC | PRN
Start: 2020-05-22 — End: 2020-05-30

## 2020-05-22 MED ORDER — FENTANYL CITRATE (PF) 100 MCG/2ML IJ SOLN: 25.0000 ug | INTRAMUSCULAR | Status: DC | PRN

## 2020-05-22 MED ORDER — SODIUM CHLORIDE FLUSH 0.9 % IV SOLN
INTRAVENOUS | Status: AC
  Filled 2020-05-22: qty 10

## 2020-05-22 MED ORDER — ROCURONIUM BROMIDE 100 MG/10ML IV SOLN
INTRAVENOUS | Status: DC | PRN
  Administered 2020-05-22: 50 mg via INTRAVENOUS
  Administered 2020-05-22: 10 mg via INTRAVENOUS

## 2020-05-22 MED ORDER — BUPIVACAINE-EPINEPHRINE 0.25% -1:200000 IJ SOLN
INTRAMUSCULAR | Status: DC | PRN
  Administered 2020-05-22: 30 mL

## 2020-05-22 MED ORDER — FENTANYL CITRATE (PF) 100 MCG/2ML IJ SOLN
INTRAMUSCULAR | Status: DC | PRN
  Administered 2020-05-22 (×2): 50 ug via INTRAVENOUS

## 2020-05-22 MED ORDER — MIDAZOLAM HCL 2 MG/2ML IJ SOLN
INTRAMUSCULAR | Status: AC
  Filled 2020-05-22: qty 2

## 2020-05-22 MED ORDER — GLYCOPYRROLATE 0.2 MG/ML IJ SOLN
INTRAMUSCULAR | Status: DC | PRN
  Administered 2020-05-22 (×2): .1 mg via INTRAVENOUS

## 2020-05-22 MED ORDER — PIPERACILLIN-TAZOBACTAM 3.375 G IVPB
INTRAVENOUS | Status: AC
  Filled 2020-05-22: qty 50

## 2020-05-22 MED ORDER — LIDOCAINE HCL (PF) 2 % IJ SOLN
INTRAMUSCULAR | Status: AC
  Filled 2020-05-22: qty 5

## 2020-05-22 MED ORDER — DEXAMETHASONE SODIUM PHOSPHATE 10 MG/ML IJ SOLN
INTRAMUSCULAR | Status: AC
  Filled 2020-05-22: qty 1

## 2020-05-22 MED ORDER — HYDROCODONE-ACETAMINOPHEN 5-325 MG PO TABS: 1.0000 | ORAL_TABLET | ORAL | Status: DC | PRN

## 2020-05-22 MED ORDER — PROPOFOL 10 MG/ML IV BOLUS
INTRAVENOUS | Status: AC
  Filled 2020-05-22: qty 20

## 2020-05-22 MED ORDER — PROPOFOL 10 MG/ML IV BOLUS
INTRAVENOUS | Status: DC | PRN
  Administered 2020-05-22: 180 mg via INTRAVENOUS
  Administered 2020-05-22: 20 mg via INTRAVENOUS

## 2020-05-22 MED ORDER — MIDAZOLAM HCL 2 MG/2ML IJ SOLN
INTRAMUSCULAR | Status: DC | PRN
  Administered 2020-05-22: 2 mg via INTRAVENOUS

## 2020-05-22 MED ORDER — ONDANSETRON HCL 4 MG/2ML IJ SOLN: 4.0000 mg | Freq: Once | INTRAMUSCULAR | Status: DC | PRN

## 2020-05-22 MED ORDER — FENTANYL CITRATE (PF) 100 MCG/2ML IJ SOLN
INTRAMUSCULAR | Status: AC
  Filled 2020-05-22: qty 2

## 2020-05-22 MED ORDER — ONDANSETRON HCL 4 MG/2ML IJ SOLN
INTRAMUSCULAR | Status: DC | PRN
  Administered 2020-05-22: 4 mg via INTRAVENOUS

## 2020-05-22 SURGICAL SUPPLY — 36 items
BLADE CLIPPER SURG (BLADE) ×2 IMPLANT
CANISTER SUCT 1200ML W/VALVE (MISCELLANEOUS) ×2 IMPLANT
CHLORAPREP W/TINT 26 (MISCELLANEOUS) ×2 IMPLANT
COVER WAND RF STERILE (DRAPES) ×2 IMPLANT
CUTTER FLEX LINEAR 45M (STAPLE) ×2 IMPLANT
DECANTER SPIKE VIAL GLASS SM (MISCELLANEOUS) IMPLANT
DEFOGGER SCOPE WARMER CLEARIFY (MISCELLANEOUS) ×2 IMPLANT
DERMABOND ADVANCED (GAUZE/BANDAGES/DRESSINGS) ×1
DERMABOND ADVANCED .7 DNX12 (GAUZE/BANDAGES/DRESSINGS) ×1 IMPLANT
ELECT REM PT RETURN 9FT ADLT (ELECTROSURGICAL) ×2
ELECTRODE REM PT RTRN 9FT ADLT (ELECTROSURGICAL) ×1 IMPLANT
GLOVE ORTHO TXT STRL SZ7.5 (GLOVE) ×2 IMPLANT
GOWN STRL REUS W/ TWL LRG LVL3 (GOWN DISPOSABLE) ×2 IMPLANT
GOWN STRL REUS W/TWL LRG LVL3 (GOWN DISPOSABLE) ×2
GRASPER SUT TROCAR 14GX15 (MISCELLANEOUS) IMPLANT
IRRIGATION STRYKERFLOW (MISCELLANEOUS) ×1 IMPLANT
IRRIGATOR STRYKERFLOW (MISCELLANEOUS) ×2
IV NS IRRIG 3000ML ARTHROMATIC (IV SOLUTION) ×2 IMPLANT
KIT TURNOVER KIT A (KITS) ×2 IMPLANT
NEEDLE HYPO 22GX1.5 SAFETY (NEEDLE) ×2 IMPLANT
NEEDLE INSUFFLATION 14GA 120MM (NEEDLE) IMPLANT
NS IRRIG 500ML POUR BTL (IV SOLUTION) ×2 IMPLANT
PACK LAP CHOLECYSTECTOMY (MISCELLANEOUS) ×2 IMPLANT
POUCH SPECIMEN RETRIEVAL 10MM (ENDOMECHANICALS) ×2 IMPLANT
RELOAD 45 VASCULAR/THIN (ENDOMECHANICALS) ×2 IMPLANT
SET TUBE SMOKE EVAC HIGH FLOW (TUBING) ×2 IMPLANT
SHEARS HARMONIC ACE PLUS 36CM (ENDOMECHANICALS) IMPLANT
SLEEVE ADV FIXATION 5X100MM (TROCAR) ×2 IMPLANT
SUT MNCRL 4-0 (SUTURE) ×1
SUT MNCRL 4-0 27XMFL (SUTURE) ×1
SUT VICRYL 0 AB UR-6 (SUTURE) IMPLANT
SUTURE MNCRL 4-0 27XMF (SUTURE) ×1 IMPLANT
SYS KII FIOS ACCESS ABD 5X100 (TROCAR) ×2
SYSTEM KII FIOS ACES ABD 5X100 (TROCAR) ×1 IMPLANT
TRAY FOLEY MTR SLVR 16FR STAT (SET/KITS/TRAYS/PACK) IMPLANT
TROCAR ADV FIXATION 12X100MM (TROCAR) ×2 IMPLANT

## 2020-05-22 NOTE — Progress Notes (Signed)
Logan Lamb to be D/C'd home with wife per MD order.  Discussed prescriptions and follow up appointments with the patient. Prescriptions given to patient, medication list explained in detail. Pt verbalized understanding.  Allergies as of 05/22/2020   No Known Allergies      Medication List     TAKE these medications    aluminum-magnesium hydroxide-simethicone 200-200-20 MG/5ML Susp Commonly known as: MAALOX Take 30 mLs by mouth 4 (four) times daily -  before meals and at bedtime.   amLODipine 5 MG tablet Commonly known as: NORVASC Take 5 mg by mouth daily.   amoxicillin-clavulanate 875-125 MG tablet Commonly known as: Augmentin Take 1 tablet by mouth every 12 (twelve) hours for 7 days.   HYDROcodone-acetaminophen 5-325 MG tablet Commonly known as: NORCO/VICODIN Take 1 tablet by mouth every 6 (six) hours as needed for moderate pain.   ibuprofen 800 MG tablet Commonly known as: ADVIL Take 1 tablet (800 mg total) by mouth every 8 (eight) hours as needed.   lisinopril 20 MG tablet Commonly known as: ZESTRIL Take 20 mg by mouth daily.   metoprolol tartrate 25 MG tablet Commonly known as: LOPRESSOR Take 1 tablet (25 mg total) by mouth 2 (two) times daily.               Discharge Care Instructions  (From admission, onward)           Start     Ordered   05/22/20 0000  Discharge wound care:       Comments: Your incision was closed with Dermabond.  It is best to keep it clean and dry, it will tolerate Logan brief shower, but do not soak it or apply any creams or lotions to the incisions.  The Dermabond should gradually flake off over time.  Keep it open to air so you can evaluate your incisions.  Dermabond assists the underlying sutures to keep your incision closed and protected from infection.  Should you develop some drainage from your incision, some drops of drainage would be okay but if it persists continue to put keep Logan dry dressing over it.   05/22/20 1827             Vitals:   05/22/20 1453 05/22/20 1548  BP: 122/77 124/75  Pulse: 80 71  Resp: 17 18  Temp: 99.1 F (37.3 C) 99.1 F (37.3 C)  SpO2: 94% 96%    Skin clean, dry and intact without evidence of skin break down, no evidence of skin tears noted. IV catheter discontinued intact. Site without signs and symptoms of complications. Dressing and pressure applied. Pt denies pain at this time. No complaints noted.  An After Visit Summary was printed and given to the patient. Patient escorted via WC, and D/C home via private auto.  Logan Lamb Logan Lamb

## 2020-05-22 NOTE — Discharge Summary (Signed)
Physician Discharge Summary  Patient ID: Logan Lamb MRN: [id number] DOB/AGE: [birth year] 39 y.o.  Admit date: 05/21/2020 Discharge date: 05/22/2020  Admission Diagnoses: Acute appendicitis  Discharge Diagnoses:  Active Problems:   Acute appendicitis   Discharged Condition: good  Hospital Course: Admitted directly to the OR, laparoscopic appendectomy completed.  Patient tolerated clear liquid diet had adequate pain control and demanded to go home.  Consults: None  Significant Diagnostic Studies: CT scan, lab work.  Treatments: surgery: As above.  Discharge Exam: Blood pressure 124/75, pulse 71, temperature 99.1 F (37.3 C), temperature source Oral, resp. rate 18, height 5\' 5"  (1.651 m), weight 81.6 kg, SpO2 96 %. Skin: Incisions clean, dry and intact.  Abdomen: Benign and nontender.  Disposition: Discharge disposition: 01-Home or Self Care       Discharge Instructions     Diet - low sodium heart healthy   Complete by: As directed    Discharge wound care:   Complete by: As directed    Your incision was closed with Dermabond.  It is best to keep it clean and dry, it will tolerate a brief shower, but do not soak it or apply any creams or lotions to the incisions.  The Dermabond should gradually flake off over time.  Keep it open to air so you can evaluate your incisions.  Dermabond assists the underlying sutures to keep your incision closed and protected from infection.  Should you develop some drainage from your incision, some drops of drainage would be okay but if it persists continue to put keep a dry dressing over it.   Driving Restrictions   Complete by: As directed    No driving until cleared after follow-up appointment.  Is not advised to drive while taking narcotic pain medications or in significant pain.   Increase activity slowly   Complete by: As directed    Lifting restrictions   Complete by: As directed    Strongly advised against any form of lifting greater  than 15 pounds over the next 4 to 6 weeks.  This involves pushing/pulling movements as well.  After 4 weeks when may gradually engage in more activities remaining aware of any new pain/tenderness elicited, and avoiding those for the full duration of 6 weeks.  Walking is encouraged.  Climbing stairs with caution.      Allergies as of 05/22/2020   No Known Allergies      Medication List     TAKE these medications    aluminum-magnesium hydroxide-simethicone 200-200-20 MG/5ML Susp Commonly known as: MAALOX Take 30 mLs by mouth 4 (four) times daily -  before meals and at bedtime.   amLODipine 5 MG tablet Commonly known as: NORVASC Take 5 mg by mouth daily.   amoxicillin-clavulanate 875-125 MG tablet Commonly known as: Augmentin Take 1 tablet by mouth every 12 (twelve) hours for 7 days.   HYDROcodone-acetaminophen 5-325 MG tablet Commonly known as: NORCO/VICODIN Take 1 tablet by mouth every 6 (six) hours as needed for moderate pain.   ibuprofen 800 MG tablet Commonly known as: ADVIL Take 1 tablet (800 mg total) by mouth every 8 (eight) hours as needed.   lisinopril 20 MG tablet Commonly known as: ZESTRIL Take 20 mg by mouth daily.   metoprolol tartrate 25 MG tablet Commonly known as: LOPRESSOR Take 1 tablet (25 mg total) by mouth 2 (two) times daily.               Discharge Care Instructions  (From admission, onward)  Start     Ordered   05/22/20 0000  Discharge wound care:       Comments: Your incision was closed with Dermabond.  It is best to keep it clean and dry, it will tolerate a brief shower, but do not soak it or apply any creams or lotions to the incisions.  The Dermabond should gradually flake off over time.  Keep it open to air so you can evaluate your incisions.  Dermabond assists the underlying sutures to keep your incision closed and protected from infection.  Should you develop some drainage from your incision, some drops of drainage would  be okay but if it persists continue to put keep a dry dressing over it.   05/22/20 1827            Follow-up Information     Ronny Bacon, MD Follow up in 2 week(s).   Specialty: General Surgery Contact information: [address] [place] [address] [phone]                 Signed: Ronny Bacon, M.D., [facility] 05/22/2020, 6:27 PM

## 2020-05-22 NOTE — Anesthesia Procedure Notes (Addendum)
Procedure Name: Intubation Date/Time: 05/22/2020 12:03 PM Performed by: Cristino Martes, RN Pre-anesthesia Checklist: Patient identified, Emergency Drugs available, Suction available and Patient being monitored Patient Re-evaluated:Patient Re-evaluated prior to induction Oxygen Delivery Method: Circle system utilized Preoxygenation: Pre-oxygenation with 100% oxygen Induction Type: IV induction Ventilation: Mask ventilation without difficulty Laryngoscope Size: McGraph and 4 Tube type: Oral Tube size: 7.5 mm Number of attempts: 1 Airway Equipment and Method: Stylet,  Oral airway and Video-laryngoscopy Placement Confirmation: ETT inserted through vocal cords under direct vision,  positive ETCO2 and breath sounds checked- equal and bilateral Secured at: 22 cm Tube secured with: Tape Comments: Right lower tooth medial to incisor noted to be extremely loose on initial induction when using scissor technique to open mouth.  Decision made with MDA to utilize McGrath and limit pressure placed on tooth. Tooth remains intact after intubation.  Will exercise caution during emergence/extubation.

## 2020-05-22 NOTE — ED Notes (Signed)
Attempted to call report, floor unable to accept at this time. RN Viviann Spare informed this RN of a return call within 5 minutes

## 2020-05-22 NOTE — H&P (Signed)
Patient ID: Logan Lamb, male   DOB: 12-09-1980, 39 y.o.   MRN: [id number]  Chief Complaint: Right lower quadrant pain  History of Present Illness Logan Lamb is a 39 y.o. male with a history of intermittent sharp pain beginning 3 days ago, Sunday the pain became more progressive and oriented in the right lower quadrant.  With pelvic the pain became intolerable yesterday morning and progressive over 10 hours.  Reports the pain radiates to his right groin.  Denies any prior symptoms of similar pain.  Denies fevers and chills, and vomiting.  He denies an appetite, anorexic with nausea.  No past surgical history.  Past Medical History Past Medical History:  Diagnosis Date  . Hypertension       History reviewed. No pertinent surgical history.  No Known Allergies  Current Facility-Administered Medications  Medication Dose Route Frequency Provider Last Rate Last Admin  . 0.9 %  sodium chloride infusion   Intravenous Continuous Campbell Lerner, MD 125 mL/hr at 05/22/20 0043 125 mL/hr at 05/22/20 0043  . lisinopril (ZESTRIL) tablet 10 mg  10 mg Oral Daily Campbell Lerner, MD      . metoprolol tartrate (LOPRESSOR) tablet 25 mg  25 mg Oral BID Campbell Lerner, MD   25 mg at 05/22/20 0041  . morphine 2 MG/ML injection 2 mg  2 mg Intravenous Q3H PRN Campbell Lerner, MD   2 mg at 05/22/20 0701  . ondansetron (ZOFRAN-ODT) disintegrating tablet 4 mg  4 mg Oral Q6H PRN Campbell Lerner, MD       Or  . ondansetron [facility]) injection 4 mg  4 mg Intravenous Q6H PRN Campbell Lerner, MD      . pantoprazole sodium (PROTONIX) 40 mg/20 mL oral suspension 40 mg  40 mg Oral Daily Campbell Lerner, MD      . zolpidem (AMBIEN) tablet 5 mg  5 mg Oral QHS PRN,MR X 1 Campbell Lerner, MD   5 mg at 05/22/20 0053    Family History History reviewed. No pertinent family history.    Social History Social History   Tobacco Use  . Smoking status: Current Every Day Smoker    Packs/day: 0.75    Types: Cigarettes   . Smokeless tobacco: Former Engineer, water Use Topics  . Alcohol use: Yes    Comment: daily  . Drug use: No        Review of Systems  Constitutional: Negative for chills and fever.  HENT: Negative for hearing loss.   Eyes: Negative.   Respiratory: Negative for cough and shortness of breath.   Cardiovascular: Negative for chest pain.  Gastrointestinal: Negative for diarrhea and heartburn.  Genitourinary: Negative for dysuria.  Musculoskeletal: Negative for myalgias.  Skin: Negative for itching and rash.  Neurological: Negative.   Endo/Heme/Allergies: Negative.       Physical Exam Blood pressure 133/87, pulse 84, temperature 98.7 F (37.1 C), temperature source Oral, resp. rate 17, height 5\' 5"  (1.651 m), weight 81.6 kg, SpO2 99 %. Last Weight  Most recent update: 05/22/2020  5:32 AM   Weight  81.6 kg (180 lb)            CONSTITUTIONAL: Well developed, and nourished, appropriately responsive and aware without distress.   EYES: Sclera non-icteric.   EARS, NOSE, MOUTH AND THROAT: Mask worn.     Hearing is intact to voice.  NECK: Trachea is midline, and there is no jugular venous distension.  LYMPH NODES:  Lymph nodes in the neck are not  enlarged. RESPIRATORY:  Lungs are clear, and breath sounds are equal bilaterally. Normal respiratory effort without pathologic use of accessory muscles. CARDIOVASCULAR: Heart is regular in rate and rhythm. GI: The abdomen is firm with difficult to distract voluntary guarding.  Clearly guards with palpation to the right lower quadrant involuntarily.  Otherwise he is soft to palpation and percussion in the other 3 quadrants.  There were no palpable masses. I did not appreciate hepatosplenomegaly.  MUSCULOSKELETAL:  Symmetrical muscle tone appreciated in all four extremities.    SKIN: Skin turgor is normal. No pathologic skin lesions appreciated.  NEUROLOGIC:  Motor and sensation appear grossly normal.  Cranial nerves are grossly without  defect. PSYCH:  Alert and oriented to person, place and time. Affect is appropriate for situation.  Data Reviewed I have personally reviewed what is currently available of the patient's imaging, recent labs and medical records.   Labs:  CBC Latest Ref Rng & Units 05/21/2020 04/30/2017 07/28/2016  WBC 4.0 - 10.5 K/uL 14.2(H) 9.8 11.3(H)  Hemoglobin 13.0 - 17.0 g/dL 86.7 18.1(H) 17.0  Hematocrit 39 - 52 % 46.1 52.4(H) 48.4  Platelets 150 - 400 K/uL 255 279 253   CMP Latest Ref Rng & Units 05/21/2020 04/30/2017 07/28/2016  Glucose 70 - 99 mg/dL 672(C) 947(S) 83  BUN 6 - 20 mg/dL 13 14 17   Creatinine 0.61 - 1.24 mg/dL 9.62 8.36)  Sodium 135 - 145 mmol/L 135 137 138  Potassium 3.5 - 5.1 mmol/L 3.5 3.2(L) 3.9  Chloride 98 - 111 mmol/L 104 102 104  CO2 22 - 32 mmol/L 19(L) 26 28  Calcium 8.9 - 10.3 mg/dL 9.3 9.4 9.6  Total Protein 6.5 - 8.1 g/dL 7.6 - 7.5  Total Bilirubin 0.3 - 1.2 mg/dL 6.29(U) - 0.3  Alkaline Phos 38 - 126 U/L 70 - 71  AST 15 - 41 U/L 29 - 25  ALT 0 - 44 U/L 24 - 22      Imaging: Radiology review: The CT images below were personally reviewed. Within last 24 hrs: CT ABDOMEN PELVIS W CONTRAST  Result Date: 05/21/2020 CLINICAL DATA:  Acute right lower quadrant abdominal pain. EXAM: CT ABDOMEN AND PELVIS WITH CONTRAST TECHNIQUE: Multidetector CT imaging of the abdomen and pelvis was performed using the standard protocol following bolus administration of intravenous contrast. CONTRAST:  05/23/2020 OMNIPAQUE IOHEXOL 300 MG/ML  SOLN COMPARISON:  December 09, 2014. FINDINGS: Lower chest: No acute abnormality. Hepatobiliary: No focal liver abnormality is seen. No gallstones, gallbladder wall thickening, or biliary dilatation. Pancreas: Unremarkable. No pancreatic ductal dilatation or surrounding inflammatory changes. Spleen: Normal in size without focal abnormality. Adrenals/Urinary Tract: Adrenal glands are unremarkable. Kidneys are normal, without renal calculi, focal lesion, or  hydronephrosis. Bladder is unremarkable. Stomach/Bowel: The stomach appears normal. There is no evidence of bowel obstruction. The appendix is enlarged with surrounding inflammation consistent with acute appendicitis. It measures 12 mm in maximum diameter. Large amount of inflammatory stranding and probable fluid is noted around the appendix. The the appendix is very ill-defined in its margins, and perforation cannot be excluded. No definite defined fluid collection is noted. Vascular/Lymphatic: No significant vascular findings are present. No enlarged abdominal or pelvic lymph nodes. Reproductive: Prostate is unremarkable. Other: No abdominal wall hernia or abnormality. No abdominopelvic ascites. Musculoskeletal: No acute or significant osseous findings. IMPRESSION: Findings consistent with acute appendicitis. The appendix is very ill-defined in its margins with a large amount of surrounding inflammation and some fluid, and perforation cannot be excluded. No definite well-circumscribed  or well-defined fluid collection is noted. Electronically Signed   By: Marijo Conception M.D.   On: 05/21/2020 20:50   US SCROTUM W/DOPPLER  Result Date: 05/21/2020 CLINICAL DATA:  Groin and lower abdominal pain EXAM: SCROTAL ULTRASOUND DOPPLER ULTRASOUND OF THE TESTICLES TECHNIQUE: Complete ultrasound examination of the testicles, epididymis, and other scrotal structures was performed. Color and spectral Doppler ultrasound were also utilized to evaluate blood flow to the testicles. COMPARISON:  None. FINDINGS: Right testicle Measurements: 4.3 x 2.0 x 3.1 cm. No mass or microlithiasis visualized. Left testicle Measurements: 4.3 x 1.9 x 3.2 cm. No mass or microlithiasis visualized. Right epididymis:  5 mm epididymal head cyst Left epididymis:  5 mm epididymal head cyst. Hydrocele:  None visualized. Varicocele:  Mild left varicocele. 4 mm scrotal pearl noted within the left scrotum. Pulsed Doppler interrogation of both testes  demonstrates normal low resistance arterial and venous waveforms bilaterally. IMPRESSION: No testicular abnormality or evidence of torsion. Mild left hydrocele. Electronically Signed   By: Rolm Baptise M.D.   On: 05/21/2020 19:10    Assessment    Acute appendicitis. Patient Active Problem List   Diagnosis Date Noted  . Acute appendicitis 05/21/2020    Plan    Laparoscopic appendectomy.  Considering his history I am not anticipating significant irrigation, potential drainage. The risks, benefits, complications, treatment options, and expected outcomes were discussed with the patient. The treatment of antibiotics alone was discussed giving a 20% chance that this could fail and surgery would be necessary.  Also discussed continuing to the operating room for Laparoscopic Appendectomy.  The possibilities of  bleeding, recurrent infection, perforation of viscus, finding a normal appendix, the need for additional procedures, failure to diagnose a condition, conversion to open procedure and creating a complication requiring transfusion or further operations were discussed. The patient was given the opportunity to ask questions and have them answered.  Patient would like to proceed with Laparoscopic Appendectomy and consent was obtained.  CBC Latest Ref Rng & Units 05/21/2020 04/30/2017 07/28/2016  WBC 4.0 - 10.5 K/uL 14.2(H) 9.8 11.3(H)  Hemoglobin 13.0 - 17.0 g/dL 16.5 18.1(H) 17.0  Hematocrit 39 - 52 % 46.1 52.4(H) 48.4  Platelets 150 - 400 K/uL 255 279 253    Face-to-face time spent with the patient and accompanying care providers(if present) was 30 minutes, with more than 50% of the time spent counseling, educating, and coordinating care of the patient.      Ronny Bacon M.D., FACS 05/22/2020, 7:23 AM

## 2020-05-22 NOTE — Op Note (Signed)
Laparascopic appendectomy   Logan Lamb Date of operation:  05/22/2020  Indications: The patient presented with a history of  abdominal pain. Workup has revealed findings consistent with acute appendicitis.  Pre-operative Diagnosis: Acute appendicitis without mention of peritonitis  Post-operative Diagnosis: Other appendicitis, hemorrhagic?   Surgeon: Campbell Lerner, M.D., FACS  Anesthesia: General with endotracheal tube  Findings: Questionably torsed, hemorrhagic infarcted appearing appendiceal mass, with normal-appearing serosa at the cecal junction.  Peritoneal fluid serosanguineous.  Estimated Blood Loss: 25 mL         Specimens: appendix         Complications: None  Procedure Details  The patient was seen again in the preop area. The options of surgery versus observation were reviewed with the patient and/or family. The risks of bleeding, infection, recurrence of symptoms, negative laparoscopy, potential for an open procedure, bowel injury, abscess or infection, were all reviewed as well. The patient was taken to Operating Room, identified as Logan Lamb and the procedure verified as laparoscopic appendectomy. A Time Out was held and the above information confirmed. The patient was placed in the supine position and general anesthesia was induced.  Antibiotic prophylaxis was administered pre-op and VT E prophylaxis was in place.   The abdomen was prepped and draped in a sterile fashion. Local infiltration with 0.25% Marcaine with epi is administered to all incisions.  An infraumbilical incision was made.  A towel grasper is applied for countertraction, and a 5 mm optical trocar was passed into the peritoneal cavity under direct visualization.  Pneumoperitoneum obtained. One 12 mm port in the LLQ, and another 5 mm port were placed under direct visualization.  The appendix was identified.  The appendix was carefully dissected.  Mobilizing the appendix bluntly, and then from the  adjacent soft tissues densely adherent to the appendical cecal base.  Fully able pull to mobilize the cecum and apply a stapler without difficulty.  The mesoappendix was divided with Harmonic scalpel. The base of the appendix was dissected out and divided with a 45 mm white load Endo GIA.The appendix was placed in a Endo Catch bag and removed via the 12 mm LLQ port. The right lower quadrant and pelvis was then irrigated with  normal saline which was aspirated. Inspection failed to identify any additional bleeding and there were no signs of bowel injury. Again the right lower quadrant was inspected there was no sign of bleeding or bowel injury. The LLQ fascia was closed with 0 Vicryl under direct visualization, pneumoperitoneum previously released, all ports having been removed, and the skin incisions were approximated with subcuticular 4-0 Monocryl. Dermabond was applied.  The patient tolerated the procedure well, there were no complications. The sponge lap and needle count were correct at the end of the procedure.  The patient was taken to the recovery room in stable condition to be discharged to home, probably in the morning, when appropriate.   Campbell Lerner, M.D., FACS 05/22/2020 - 1:31 PM

## 2020-05-22 NOTE — Transfer of Care (Signed)
Immediate Anesthesia Transfer of Care Note  Patient: Logan Lamb  Procedure(s) Performed: APPENDECTOMY LAPAROSCOPIC (N/A )  Patient Location: PACU  Anesthesia Type:General  Level of Consciousness: awake, alert  and patient cooperative  Airway & Oxygen Therapy: Patient Spontanous Breathing and Patient connected to face mask oxygen  Post-op Assessment: Report given to RN and Post -op Vital signs reviewed and stable  Post vital signs: Reviewed and stable  Last Vitals:  Vitals Value Taken Time  BP 128/79 05/22/20 1330  Temp 37.5 C 05/22/20 1330  Pulse 97 05/22/20 1337  Resp 25 05/22/20 1337  SpO2 94 % 05/22/20 1337  Vitals shown include unvalidated device data.  Last Pain:  Vitals:   05/22/20 1330  TempSrc:   PainSc: 0-No pain         Complications: No complications documented.

## 2020-05-22 NOTE — Anesthesia Preprocedure Evaluation (Signed)
Anesthesia Evaluation  Patient identified by MRN, date of birth, ID band Patient awake    Reviewed: Allergy & Precautions, H&P , NPO status , Patient's Chart, lab work & pertinent test results, reviewed documented beta blocker date and time   Airway Mallampati: II  TM Distance: >3 FB Neck ROM: full    Dental  (+) Teeth Intact   Pulmonary neg pulmonary ROS, Current Smoker,    Pulmonary exam normal        Cardiovascular Exercise Tolerance: Good hypertension, On Medications negative cardio ROS Normal cardiovascular exam Rhythm:regular Rate:Normal     Neuro/Psych negative neurological ROS  negative psych ROS   GI/Hepatic negative GI ROS, Neg liver ROS,   Endo/Other  negative endocrine ROS  Renal/GU negative Renal ROS  negative genitourinary   Musculoskeletal   Abdominal   Peds  Hematology negative hematology ROS (+)   Anesthesia Other Findings Past Medical History: No date: Hypertension History reviewed. No pertinent surgical history. BMI    Body Mass Index: 29.95 kg/m     Reproductive/Obstetrics negative OB ROS                             Anesthesia Physical Anesthesia Plan  ASA: II  Anesthesia Plan: General ETT   Post-op Pain Management:    Induction:   PONV Risk Score and Plan:   Airway Management Planned:   Additional Equipment:   Intra-op Plan:   Post-operative Plan:   Informed Consent: I have reviewed the patients History and Physical, chart, labs and discussed the procedure including the risks, benefits and alternatives for the proposed anesthesia with the patient or authorized representative who has indicated his/her understanding and acceptance.     Dental Advisory Given  Plan Discussed with: CRNA  Anesthesia Plan Comments:         Anesthesia Quick Evaluation

## 2020-05-22 NOTE — ED Notes (Addendum)
This RN wasted the other 5mg  of Ambien in the sharps container with RN Temka as a witness

## 2020-05-22 NOTE — ED Notes (Signed)
Pt requesting medication for sleep at this time, See [facility]

## 2020-05-23 ENCOUNTER — Telehealth: Payer: Self-pay | Admitting: Surgery

## 2020-05-23 NOTE — Telephone Encounter (Signed)
Attempted to reach patient's wife on her number and the home number.  Message left for her to call the office back. Per Dr Claudine Mouton the patient may advance his diet as tolerated, no restrictions. He will also need a follow up appointment here in the office next week with Dr Claudine Mouton.

## 2020-05-23 NOTE — Telephone Encounter (Signed)
Wife calls, her husband had surgery yesterday with Dr. Claudine Mouton.  The patient was told to do a liquid diet but they were not told for how long.  Please call wife, Asher Muir please and thank you.

## 2020-05-23 NOTE — Telephone Encounter (Signed)
Patient notified of diet and post op scheduled with Lynden Oxford due to patient's schedule.

## 2020-05-24 ENCOUNTER — Encounter: Payer: Self-pay | Admitting: Surgery

## 2020-05-24 LAB — SURGICAL PATHOLOGY

## 2020-05-24 NOTE — Anesthesia Postprocedure Evaluation (Signed)
Anesthesia Post Note  Patient: Melony Overly  Procedure(s) Performed: APPENDECTOMY LAPAROSCOPIC (N/A )  Patient location during evaluation: PACU Anesthesia Type: General Level of consciousness: awake and alert Pain management: pain level controlled Vital Signs Assessment: post-procedure vital signs reviewed and stable Respiratory status: spontaneous breathing, nonlabored ventilation, respiratory function stable and patient connected to nasal cannula oxygen Cardiovascular status: blood pressure returned to baseline and stable Postop Assessment: no apparent nausea or vomiting Anesthetic complications: no   No complications documented.   Last Vitals:  Vitals:   05/22/20 1453 05/22/20 1548  BP: 122/77 124/75  Pulse: 80 71  Resp: 17 18  Temp: 37.3 C 37.3 C  SpO2: 94% 96%    Last Pain:  Vitals:   05/22/20 1548  TempSrc: Oral  PainSc:                  Yevette Edwards

## 2020-05-30 ENCOUNTER — Ambulatory Visit (INDEPENDENT_AMBULATORY_CARE_PROVIDER_SITE_OTHER): Payer: Self-pay | Admitting: Physician Assistant

## 2020-05-30 ENCOUNTER — Encounter: Payer: Self-pay | Admitting: Physician Assistant

## 2020-05-30 ENCOUNTER — Other Ambulatory Visit: Payer: Self-pay

## 2020-05-30 VITALS — BP 131/85 | HR 87 | Temp 98.1°F | Ht 65.0 in | Wt 161.8 lb

## 2020-05-30 DIAGNOSIS — Z09 Encounter for follow-up examination after completed treatment for conditions other than malignant neoplasm: Secondary | ICD-10-CM

## 2020-05-30 DIAGNOSIS — K353 Acute appendicitis with localized peritonitis, without perforation or gangrene: Secondary | ICD-10-CM

## 2020-05-30 NOTE — Progress Notes (Signed)
[facility] POST-OP OFFICE VISIT  05/30/2020  HPI: IMRE VECCHIONE is a 39 y.o. male 8 days s/p laparoscopic appendectomy with Dr Claudine Mouton.   Good well No major issues with pain, not taking pain medications No fever, chills, nausea, emesis Tolerating PO, + bowel function Mobilizing well  Vital signs: BP 131/85   Pulse 87   Temp 98.1 F (36.7 C)   Ht 5\' 5"  (1.651 m)   Wt 161 lb 12.8 oz (73.4 kg)   SpO2 98%   BMI 26.92 kg/m    Physical Exam: Constitutional: well appearing male, NAD Abdomen: Soft, non-tender, non-distended, no rebound/guarding Skin: Laparoscopic incisions are CDI with dermabond, no erythema or drainage   Assessment/Plan: This is a 39 y.o. male 8 days s/p laparoscopic appendectomy   - Pain control prn; tylenol +/- motrin  - reviewed lifting restrictions; provided work note  - reviewed wound care  - reviewed pathology: Acute appendicitis, negative for malignancy  - rtc prn  -- 20, PA-C [facility] 05/30/2020, 2:06 PM [phone] M-F: 7am - 4pm

## 2020-05-30 NOTE — Patient Instructions (Addendum)
GENERAL POST-OPERATIVE PATIENT INSTRUCTIONS   May rub Vitamin-E oil or other emmolient agent in area 2-3 times a day to soften. You will need to use sunscreen for the next year on the area to minimize altered pigmentation of the site.  WOUND CARE INSTRUCTIONS:  Keep a dry clean dressing on the wound if there is drainage. The initial bandage may be removed after 24 hours.  Once the wound has quit draining you may leave it open to air.  If clothing rubs against the wound or causes irritation and the wound is not draining you may cover it with a dry dressing during the daytime.  Try to keep the wound dry and avoid ointments on the wound unless directed to do so.  If the wound becomes bright red and painful or starts to drain infected material that is not clear, please contact your physician immediately.  If the wound is mildly pink and has a thick firm ridge underneath it, this is normal, and is referred to as a healing ridge.  This will resolve over the next 4-6 weeks.  BATHING: You may shower if you have been informed of this by your surgeon. However, Please do not submerge in a tub, hot tub, or pool until incisions are completely sealed or have been told by your surgeon that you may do so.  DIET:  You may eat any foods that you can tolerate.  It is a good idea to eat a high fiber diet and take in plenty of fluids to prevent constipation.  If you do become constipated you may want to take a mild laxative or take ducolax tablets on a daily basis until your bowel habits are regular.  Constipation can be very uncomfortable, along with straining, after recent surgery.  ACTIVITY:  You are encouraged to cough and deep breath or use your incentive spirometer if you were given one, every 15-30 minutes when awake.  This will help prevent respiratory complications and low grade fevers post-operatively if you had a general anesthetic.  You may want to hug a pillow when coughing and sneezing to add additional  support to the surgical area, if you had abdominal or chest surgery, which will decrease pain during these times.  You are encouraged to walk and engage in light activity for the next two weeks.  You should not lift more than 20 pounds for 4-6 weeks after surgery as it could put you at increased risk for complications. Twenty pounds is roughly equivalent to a plastic bag of groceries. At that time- Listen to your body when lifting, if you have pain when lifting, stop and then try again in a few days. Soreness after doing exercises or activities of daily living is normal as you get back in to your normal routine.  MEDICATIONS:  Try to take narcotic medications and anti-inflammatory medications, such as tylenol, ibuprofen, naprosyn, etc., with food.  This will minimize stomach upset from the medication.  Should you develop nausea and vomiting from the pain medication, or develop a rash, please discontinue the medication and contact your physician.  You should not drive, make important decisions, or operate machinery when taking narcotic pain medication.  SUNBLOCK Use sun block to incision area over the next year if this area will be exposed to sun. This helps decrease scarring and will allow you avoid a permanent darkened area over your incision.  QUESTIONS:  Please feel free to call our office if you have any questions, and we will be glad  to assist

## 2020-08-16 ENCOUNTER — Encounter: Payer: Self-pay | Admitting: Emergency Medicine

## 2020-08-16 ENCOUNTER — Other Ambulatory Visit: Payer: Self-pay

## 2020-08-16 DIAGNOSIS — Y92002 Bathroom of unspecified non-institutional (private) residence single-family (private) house as the place of occurrence of the external cause: Secondary | ICD-10-CM | POA: Insufficient documentation

## 2020-08-16 DIAGNOSIS — Z79899 Other long term (current) drug therapy: Secondary | ICD-10-CM | POA: Insufficient documentation

## 2020-08-16 DIAGNOSIS — I10 Essential (primary) hypertension: Secondary | ICD-10-CM | POA: Diagnosis not present

## 2020-08-16 DIAGNOSIS — S299XXA Unspecified injury of thorax, initial encounter: Secondary | ICD-10-CM | POA: Diagnosis present

## 2020-08-16 DIAGNOSIS — X501XXA Overexertion from prolonged static or awkward postures, initial encounter: Secondary | ICD-10-CM | POA: Diagnosis not present

## 2020-08-16 DIAGNOSIS — F17210 Nicotine dependence, cigarettes, uncomplicated: Secondary | ICD-10-CM | POA: Insufficient documentation

## 2020-08-16 DIAGNOSIS — Y9301 Activity, walking, marching and hiking: Secondary | ICD-10-CM | POA: Insufficient documentation

## 2020-08-16 DIAGNOSIS — Y998 Other external cause status: Secondary | ICD-10-CM | POA: Diagnosis not present

## 2020-08-16 DIAGNOSIS — S20212A Contusion of left front wall of thorax, initial encounter: Secondary | ICD-10-CM | POA: Insufficient documentation

## 2020-08-16 NOTE — ED Triage Notes (Signed)
Pt presents to ED with left sided back/rib pain after falling earlier this evening. Pt states he was walking to the bathroom and his feel came out from under him and he fell backwards onto marble floor. Pt states he landed on his left side of his back and left side. Pt states at first it didn't really hurt but when he got home he sneezed and now pain has increased.

## 2020-08-17 ENCOUNTER — Emergency Department: Payer: Medicaid Other

## 2020-08-17 ENCOUNTER — Emergency Department
Admission: EM | Admit: 2020-08-17 | Discharge: 2020-08-17 | Disposition: A | Discharge status: Home / Self Care | Payer: Medicaid Other | Attending: Emergency Medicine | Admitting: Emergency Medicine

## 2020-08-17 DIAGNOSIS — S20212A Contusion of left front wall of thorax, initial encounter: Secondary | ICD-10-CM

## 2020-08-17 MED ORDER — MELOXICAM 15 MG PO TABS: 15.0000 mg | ORAL_TABLET | Freq: Every day | ORAL | 0 refills | Status: DC

## 2020-08-17 MED ORDER — HYDROCODONE-ACETAMINOPHEN 5-325 MG PO TABS: 1.0000 | ORAL_TABLET | Freq: Four times a day (QID) | ORAL | 0 refills | Status: AC | PRN

## 2020-08-17 MED ORDER — CYCLOBENZAPRINE HCL 10 MG PO TABS: 5.0000 mg | ORAL_TABLET | Freq: Three times a day (TID) | ORAL | 0 refills | Status: DC | PRN

## 2020-08-17 NOTE — Discharge Instructions (Signed)
Follow-up with primary care provider your choice for symptoms that are not improving over the next couple of weeks.  Use a pillow or blanket to splint your ribs if you need to change positions, cough, or sneeze.  Return to the emergency department for symptoms of change or worsen or for new concerns if you are unable to schedule appointment with primary care.

## 2020-08-17 NOTE — ED Provider Notes (Signed)
[facility] Emergency Department Provider Note ____________________________________________  Time seen: Approximately 8:26 AM  I have reviewed the triage vital signs and the nursing notes.   HISTORY  Chief Complaint Back Pain    HPI Logan Lamb is a 39 y.o. male who presents to the emergency department for evaluation and treatment of left rib pain after a mechanical, nonsyncopal fall.  He states that he was walking to the bathroom slipped on the marble floor and landed with his left arm tucked to his side.  When he landed he landed on his arm.  Left arm is "sore" but pain is only in the left chest wall when he moves or takes a deep breath.  No alleviating measures attempted prior to arrival.   Past Medical History:  Diagnosis Date   Hypertension     Patient Active Problem List   Diagnosis Date Noted   Acute appendicitis 05/21/2020    Past Surgical History:  Procedure Laterality Date   LAPAROSCOPIC APPENDECTOMY N/A 05/22/2020   Procedure: APPENDECTOMY LAPAROSCOPIC;  Surgeon: Campbell Lerner, MD;  Location: [facility] ORS;  Service: General;  Laterality: N/A;    Prior to Admission medications   Medication Sig Start Date End Date Taking? Authorizing Provider  amLODipine (NORVASC) 5 MG tablet Take 5 mg by mouth daily. 04/18/20   [provider]  cyclobenzaprine (FLEXERIL) 10 MG tablet Take 0.5 tablets (5 mg total) by mouth 3 (three) times daily as needed for muscle spasms. 08/17/20   Raygen Linquist, Rulon Eisenmenger B, FNP  HYDROcodone-acetaminophen (NORCO/VICODIN) 5-325 MG tablet Take 1 tablet by mouth every 6 (six) hours as needed for up to 3 days for severe pain. 08/17/20 08/20/20  Jaedin Trumbo, Rulon Eisenmenger B, FNP  lisinopril (ZESTRIL) 20 MG tablet Take 20 mg by mouth daily. 01/12/20   [provider]  meloxicam (MOBIC) 15 MG tablet Take 1 tablet (15 mg total) by mouth daily. 08/17/20   Darleny Sem, Rulon Eisenmenger B, FNP  metoprolol tartrate (LOPRESSOR) 25 MG tablet Take 1 tablet (25 mg  total) by mouth 2 (two) times daily. 11/06/17 05/21/20  Joni Reining, PA-C  PARoxetine (PAXIL) 10 MG tablet Take by mouth. 05/30/20 05/30/21  [provider]    Allergies Patient has no known allergies.  No family history on file.  Social History Social History   Tobacco Use   Smoking status: Current Every Day Smoker    Packs/day: 0.50    Types: Cigarettes   Smokeless tobacco: Former Forensic psychologist Use: Never used  Substance Use Topics   Alcohol use: Yes    Comment: daily   Drug use: No    Review of Systems Constitutional: Negative for fever. Cardiovascular: Negative for chest pain. Respiratory: Negative for shortness of breath. Musculoskeletal: Positive for left-sided rib pain Skin: Negative for open wounds or lesions Neurological: Negative for decrease in sensation  ____________________________________________   PHYSICAL EXAM:  VITAL SIGNS: ED Triage Vitals  Enc Vitals Group     BP 08/16/20 2342 (!) 154/99     Pulse Rate 08/16/20 2342 73     Resp 08/16/20 2342 20     Temp 08/16/20 2342 98.2 F (36.8 C)     Temp Source 08/16/20 2342 Oral     SpO2 08/16/20 2342 98 %     Weight 08/16/20 2343 160 lb (72.6 kg)     Height 08/16/20 2343 5\' 5"  (1.651 m)     Head Circumference --      Peak Flow --  Pain Score 08/16/20 2351 8     Pain Loc --      Pain Edu? --      Excl. in GC? --     Constitutional: Alert and oriented. Well appearing and in no acute distress. Eyes: Conjunctivae are clear without discharge or drainage Head: Atraumatic Neck: Supple.  No focal midline tenderness. Respiratory: No cough. Respirations are even and unlabored. Musculoskeletal: Diffuse anterior and lateral chest wall pain overlying the left ribs.  No focal tenderness. Gastrointestinal: No abdominal pain with palpation Neurologic: Awake, alert, oriented x4. Skin: No open wounds or lesions overlying the left chest wall Psychiatric: Affect and behavior are  appropriate.  ____________________________________________   LABS (all labs ordered are listed, but only abnormal results are displayed)  Labs Reviewed - No data to display ____________________________________________  RADIOLOGY  Imaging of the chest and left side ribs negative for fracture.  I, Kem Boroughs, personally viewed and evaluated these images (plain radiographs) as part of my medical decision making, as well as reviewing the written report by the radiologist.  DG Ribs Unilateral W/Chest Left  Result Date: 08/17/2020 CLINICAL DATA:  Left back and rib pain after a fall EXAM: LEFT RIBS AND CHEST - 3+ VIEW COMPARISON:  Chest 07/28/2016 FINDINGS: Normal heart size and pulmonary vascularity. No focal airspace disease or consolidation in the lungs. No blunting of costophrenic angles. No pneumothorax. Mediastinal contours appear intact. Left ribs appear intact. No acute fracture or dislocation. No focal bone lesion or bone destruction. Bone cortex appears intact. Soft tissues are unremarkable. IMPRESSION: No evidence of active pulmonary disease.  Negative left ribs. Electronically Signed   By: Burman Nieves M.D.   On: 08/17/2020 00:25   ____________________________________________   PROCEDURES  Procedures  ____________________________________________   INITIAL IMPRESSION / ASSESSMENT AND PLAN / ED COURSE  Logan Lamb is a 39 y.o. who presents to the emergency department for treatment and evaluation after mechanical, nonsyncopal fall where he landed with his left arm against the left side.  See HPI for further details.  Imaging does not show any displaced rib fractures or indication of pneumothorax.  Plan will be to send in muscle relaxer, anti-inflammatory, and pain medication to his pharmacy.  He will also be provided with a work excuse as well.  Patient advised to follow-up with primary care provider for choice for symptoms that are not improving over the next couple  weeks.  He is to return to the emergency department for symptoms that change or worsen if he is unable to schedule an appointment.  Medications - No data to display  Pertinent labs & imaging results that were available during my care of the patient were reviewed by me and considered in my medical decision making (see chart for details).   _________________________________________   FINAL CLINICAL IMPRESSION(S) / ED DIAGNOSES  Final diagnoses:  Rib contusion, left, initial encounter    ED Discharge Orders         Ordered    cyclobenzaprine (FLEXERIL) 10 MG tablet  3 times daily PRN        08/17/20 0836    meloxicam (MOBIC) 15 MG tablet  Daily        08/17/20 0836    HYDROcodone-acetaminophen (NORCO/VICODIN) 5-325 MG tablet  Every 6 hours PRN        08/17/20 0836           If controlled substance prescribed during this visit, 12 month history viewed on the [facility] prior to issuing  an initial prescription for Schedule II or III opiod.   Chinita Pester, FNP 08/17/20 0836    Gilles Chiquito, MD 08/17/20 [phone]

## 2021-10-01 ENCOUNTER — Other Ambulatory Visit: Payer: Self-pay

## 2021-10-01 ENCOUNTER — Emergency Department
Admission: EM | Admit: 2021-10-01 | Discharge: 2021-10-01 | Disposition: A | Discharge status: Home / Self Care | Payer: 59 | Attending: Emergency Medicine | Admitting: Emergency Medicine

## 2021-10-01 ENCOUNTER — Emergency Department: Payer: 59

## 2021-10-01 DIAGNOSIS — Z79899 Other long term (current) drug therapy: Secondary | ICD-10-CM | POA: Diagnosis not present

## 2021-10-01 DIAGNOSIS — F17210 Nicotine dependence, cigarettes, uncomplicated: Secondary | ICD-10-CM | POA: Diagnosis not present

## 2021-10-01 DIAGNOSIS — J189 Pneumonia, unspecified organism: Secondary | ICD-10-CM | POA: Diagnosis not present

## 2021-10-01 DIAGNOSIS — Z20822 Contact with and (suspected) exposure to covid-19: Secondary | ICD-10-CM | POA: Insufficient documentation

## 2021-10-01 DIAGNOSIS — I10 Essential (primary) hypertension: Secondary | ICD-10-CM | POA: Insufficient documentation

## 2021-10-01 DIAGNOSIS — J40 Bronchitis, not specified as acute or chronic: Secondary | ICD-10-CM | POA: Diagnosis not present

## 2021-10-01 DIAGNOSIS — R0602 Shortness of breath: Secondary | ICD-10-CM | POA: Diagnosis present

## 2021-10-01 DIAGNOSIS — Z720 Tobacco use: Secondary | ICD-10-CM

## 2021-10-01 LAB — BASIC METABOLIC PANEL
Anion gap: 7 (ref 5–15)
BUN: 11 mg/dL (ref 6–20)
CO2: 25 mmol/L (ref 22–32)
Calcium: 9.1 mg/dL (ref 8.9–10.3)
Chloride: 105 mmol/L (ref 98–111)
Creatinine, Ser: 1.05 mg/dL (ref 0.61–1.24)
GFR, Estimated: >60 mL/min (ref >60)
Glucose, Bld: 102 mg/dL — ABNORMAL HIGH (ref 70–99)
Potassium: 3.8 mmol/L (ref 3.5–5.1)
Sodium: 137 mmol/L (ref 135–145)

## 2021-10-01 LAB — RESP PANEL BY RT-PCR (FLU A&B, COVID) ARPGX2
Influenza A by PCR: NEGATIVE
Influenza B by PCR: NEGATIVE
SARS Coronavirus 2 by RT PCR: NEGATIVE

## 2021-10-01 LAB — CBC
HCT: 47.2 % (ref 39.0–52.0)
Hemoglobin: 17.4 g/dL — ABNORMAL HIGH (ref 13.0–17.0)
MCH: 33.8 pg (ref 26.0–34.0)
MCHC: 36.9 g/dL — ABNORMAL HIGH (ref 30.0–36.0)
MCV: 91.7 fL (ref 80.0–100.0)
Platelets: 277 10*3/uL (ref 150–400)
RBC: 5.15 MIL/uL (ref 4.22–5.81)
RDW: 12.5 % (ref 11.5–15.5)
WBC: 12.2 10*3/uL — ABNORMAL HIGH (ref 4.0–10.5)
nRBC: 0 % (ref 0.0–0.2)

## 2021-10-01 LAB — TROPONIN I (HIGH SENSITIVITY): Troponin I (High Sensitivity): 6 ng/L (ref <18)

## 2021-10-01 MED ORDER — DOXYCYCLINE HYCLATE 100 MG PO CAPS: 100.0000 mg | ORAL_CAPSULE | Freq: Two times a day (BID) | ORAL | 0 refills | Status: AC

## 2021-10-01 MED ORDER — ONDANSETRON HCL 4 MG PO TABS: 4.0000 mg | ORAL_TABLET | Freq: Three times a day (TID) | ORAL | 0 refills | Status: AC | PRN

## 2021-10-01 MED ORDER — BENZONATATE 100 MG PO CAPS: 100.0000 mg | ORAL_CAPSULE | Freq: Three times a day (TID) | ORAL | 0 refills | Status: DC | PRN

## 2021-10-01 MED ORDER — ACETAMINOPHEN 500 MG PO TABS
1000.0000 mg | ORAL_TABLET | Freq: Once | ORAL | Status: AC
  Administered 2021-10-01: 1000 mg via ORAL
  Filled 2021-10-01: qty 2

## 2021-10-01 MED ORDER — IBUPROFEN 400 MG PO TABS
400.0000 mg | ORAL_TABLET | Freq: Once | ORAL | Status: AC
  Administered 2021-10-01: 400 mg via ORAL
  Filled 2021-10-01: qty 1

## 2021-10-01 NOTE — Discharge Instructions (Addendum)
Your blood pressure was high today.  Please have this rechecked by primary care physician or urgent care in the next 5 to 7 days.

## 2021-10-01 NOTE — ED Triage Notes (Signed)
Pt here with fever, cough, and CP that started Sunday. Pt is having a cough, CP, chills, and nausea. Pt in NAD in triage.

## 2021-10-01 NOTE — ED Notes (Signed)
See triage note  presents with low grade temp body aches   and cough  sxs' started on Sunday

## 2021-10-01 NOTE — ED Provider Notes (Signed)
[facility] Emergency Department Provider Note  ____________________________________________   Event Date/Time   First MD Initiated Contact with Patient 10/01/21 1416     (approximate)  I have reviewed the triage vital signs and the nursing notes.   HISTORY  Chief Complaint Fever, Cough, and Chest Pain   HPI Logan Lamb is a 40 y.o. male with past medical history of tobacco abuse and hypertension who presents for assessment approximately 3 days of some substernal chest tightness associated with cough, some intermittent shortness of breath, chills, nausea, fevers.  He denies any hemoptysis, vomiting, diarrhea, abdominal pain, burning with urination, rash or focal extremity pain weakness numbness or tingling.  No vision changes, vertigo, sore throat or earache.  Denies illicit drug use or significant EtOH use.  No other acute concerns at this time.         Past Medical History:  Diagnosis Date   Hypertension     Patient Active Problem List   Diagnosis Date Noted   Acute appendicitis 05/21/2020    Past Surgical History:  Procedure Laterality Date   LAPAROSCOPIC APPENDECTOMY N/A 05/22/2020   Procedure: APPENDECTOMY LAPAROSCOPIC;  Surgeon: Campbell Lerner, MD;  Location: [facility] ORS;  Service: General;  Laterality: N/A;    Prior to Admission medications   Medication Sig Start Date End Date Taking? Authorizing Provider  benzonatate (TESSALON PERLES) 100 MG capsule Take 1 capsule (100 mg total) by mouth 3 (three) times daily as needed for cough. 10/01/21 10/01/22 Yes Gilles Chiquito, MD  doxycycline (VIBRAMYCIN) 100 MG capsule Take 1 capsule (100 mg total) by mouth 2 (two) times daily for 7 days. 10/01/21 10/08/21 Yes Gilles Chiquito, MD  ondansetron (ZOFRAN) 4 MG tablet Take 1 tablet (4 mg total) by mouth every 8 (eight) hours as needed for up to 10 doses for nausea or vomiting. 10/01/21  Yes Gilles Chiquito, MD  amLODipine (NORVASC) 5 MG tablet Take 5  mg by mouth daily. 04/18/20   [provider]  cyclobenzaprine (FLEXERIL) 10 MG tablet Take 0.5 tablets (5 mg total) by mouth 3 (three) times daily as needed for muscle spasms. 08/17/20   Triplett, Cari B, FNP  lisinopril (ZESTRIL) 20 MG tablet Take 20 mg by mouth daily. 01/12/20   [provider]  meloxicam (MOBIC) 15 MG tablet Take 1 tablet (15 mg total) by mouth daily. 08/17/20   Triplett, Rulon Eisenmenger B, FNP  metoprolol tartrate (LOPRESSOR) 25 MG tablet Take 1 tablet (25 mg total) by mouth 2 (two) times daily. 11/06/17 05/21/20  Joni Reining, PA-C  PARoxetine (PAXIL) 10 MG tablet Take by mouth. 05/30/20 05/30/21  [provider]    Allergies Patient has no known allergies.  No family history on file.  Social History Social History   Tobacco Use   Smoking status: Every Day    Packs/day: 0.50    Types: Cigarettes   Smokeless tobacco: Former  Building services engineer Use: Never used  Substance Use Topics   Alcohol use: Yes    Comment: daily   Drug use: No    Review of Systems  Review of Systems  Constitutional:  Positive for chills and fever.  HENT:  Negative for sore throat.   Eyes:  Negative for pain.  Respiratory:  Positive for cough and shortness of breath. Negative for stridor.   Cardiovascular:  Positive for chest pain.  Gastrointestinal:  Negative for vomiting.  Musculoskeletal:  Positive for myalgias.  Skin:  Negative for rash.  Neurological:  Positive for weakness and headaches. Negative for seizures and loss of consciousness.  Psychiatric/Behavioral:  Negative for suicidal ideas.   All other systems reviewed and are negative.    ____________________________________________   PHYSICAL EXAM:  VITAL SIGNS: ED Triage Vitals [10/01/21 1200]  Enc Vitals Group     BP (!) 150/98     Pulse Rate 99     Resp 18     Temp 100 F (37.8 C)     Temp Source Oral     SpO2 97 %     Weight 180 lb (81.6 kg)     Height 5\' 4"  (1.626 m)     Head Circumference       Peak Flow      Pain Score 8     Pain Loc      Pain Edu?      Excl. in GC?    Vitals:   10/01/21 1200  BP: (!) 150/98  Pulse: 99  Resp: 18  Temp: 100 F (37.8 C)  SpO2: 97%   Physical Exam Vitals and nursing note reviewed.  Constitutional:      Appearance: He is well-developed.  HENT:     Head: Normocephalic and atraumatic.     Right Ear: External ear normal.     Left Ear: External ear normal.     Nose: Nose normal.  Eyes:     Conjunctiva/sclera: Conjunctivae normal.  Cardiovascular:     Rate and Rhythm: Normal rate and regular rhythm.     Heart sounds: No murmur heard. Pulmonary:     Effort: Pulmonary effort is normal. No respiratory distress.     Breath sounds: Normal breath sounds.  Abdominal:     Palpations: Abdomen is soft.     Tenderness: There is no abdominal tenderness.  Musculoskeletal:     Cervical back: Neck supple.  Skin:    General: Skin is warm and dry.     Capillary Refill: Capillary refill takes less than 2 seconds.  Neurological:     Mental Status: He is alert and oriented to person, place, and time.  Psychiatric:        Mood and Affect: Mood normal.     ____________________________________________   LABS (all labs ordered are listed, but only abnormal results are displayed)  Labs Reviewed  BASIC METABOLIC PANEL - Abnormal; Notable for the following components:      Result Value   Glucose, Bld 102 (*)    All other components within normal limits  CBC - Abnormal; Notable for the following components:   WBC 12.2 (*)    Hemoglobin 17.4 (*)    MCHC 36.9 (*)    All other components within normal limits  RESP PANEL BY RT-PCR (FLU A&B, COVID) ARPGX2  TROPONIN I (HIGH SENSITIVITY)  TROPONIN I (HIGH SENSITIVITY)   ____________________________________________  EKG  ECG shows sinus rhythm with a ventricular rate of 96, normal axis, unremarkable intervals with nonspecific change in V2 without other clear evidence of acute ischemia or  significant arrhythmia. ____________________________________________  RADIOLOGY  ED MD interpretation: Chest x-ray has no evidence of spontaneous pneumothorax, focal consolidation, effusion, pneumomediastinum, edema or other acute thoracic process.  Official radiology report(s): DG Chest 2 View  Result Date: 10/01/2021 CLINICAL DATA:  Chest pain, cough, fever EXAM: CHEST - 2 VIEW COMPARISON:  08/17/2020 FINDINGS: The heart size and mediastinal contours are within normal limits. Both lungs are clear. The visualized skeletal structures are unremarkable. IMPRESSION: No active cardiopulmonary disease. Electronically  Signed   By: Duanne Guess D.O.   On: 10/01/2021 13:16    ____________________________________________   PROCEDURES  Procedure(s) performed (including Critical Care):  Procedures   ____________________________________________   INITIAL IMPRESSION / ASSESSMENT AND PLAN / ED COURSE       Presents with above-stated history exam for assessment of fevers, cough, nausea, shortness of breath, chest pain, myalgias and overall weakness.  On arrival he is hypertensive with otherwise stable vital signs on room air.  Differential includes bacterial pneumonia, bronchitis, pericarditis and costochondritis as well as ACS, arrhythmia, metabolic derangements and anemia.  I have a low suspicion for PE given he is not hypoxic or tachypneic and is PERC negative.  No wheezing to suggest undiagnosed COPD exacerbation.  ECG shows sinus rhythm with a ventricular rate of 96, normal axis, unremarkable intervals with nonspecific change in V2 without other clear evidence of acute ischemia or significant arrhythmia.  However given nonelevated troponin obtained greater than 3 hours after symptom onset I have a low suspicion for ACS or myocarditis.   Chest x-ray has no evidence of spontaneous pneumothorax, focal consolidation, effusion, pneumomediastinum, edema or other acute thoracic  process.  CBC shows leukocytosis with WBC count of 12.2 without evidence of acute anemia and normal platelets.  BMP shows no significant electrolyte or metabolic derangements.  Kidney function is within normal limits.  Patient has been able to tolerate p.o. today.  I suspect likely bronchitis with possible atypical pneumonia.  Will cover with a course of doxycycline and prescribe Rx for Tessalon and Zofran.  Emphasized importance of adequate hydration and returning immediately if he experiences any new or acute worsening of his symptoms.  Counseled on tobacco cessation.  Patient states he wishes to leave before his COVID influenza results.  Advised him he can follow-up these results on MyChart.  Discharged in stable condition.  Strict return precautions advised and discussed.       ____________________________________________   FINAL CLINICAL IMPRESSION(S) / ED DIAGNOSES  Final diagnoses:  Community acquired pneumonia, unspecified laterality  Bronchitis  Person under investigation for COVID-19  Hypertension, unspecified type  Tobacco abuse    Medications  ibuprofen (ADVIL) tablet 400 mg (has no administration in time range)  acetaminophen (TYLENOL) tablet 1,000 mg (1,000 mg Oral Given 10/01/21 1203)     ED Discharge Orders          Ordered    doxycycline (VIBRAMYCIN) 100 MG capsule  2 times daily        10/01/21 1436    benzonatate (TESSALON PERLES) 100 MG capsule  3 times daily PRN        10/01/21 1436    ondansetron (ZOFRAN) 4 MG tablet  Every 8 hours PRN        10/01/21 1439             Note:  This document was prepared using Dragon voice recognition software and may include unintentional dictation errors.    Gilles Chiquito, MD 10/01/21 [phone]

## 2022-04-07 ENCOUNTER — Ambulatory Visit: Payer: Self-pay | Admitting: Nurse Practitioner

## 2022-04-09 ENCOUNTER — Ambulatory Visit (INDEPENDENT_AMBULATORY_CARE_PROVIDER_SITE_OTHER): Payer: 59 | Admitting: Nurse Practitioner

## 2022-04-09 ENCOUNTER — Ambulatory Visit: Payer: 59 | Admitting: Nurse Practitioner

## 2022-04-09 ENCOUNTER — Encounter: Payer: Self-pay | Admitting: Nurse Practitioner

## 2022-04-09 VITALS — BP 121/85 | HR 76 | Temp 97.2°F | Ht 65.5 in | Wt 164.6 lb

## 2022-04-09 DIAGNOSIS — Z0000 Encounter for general adult medical examination without abnormal findings: Secondary | ICD-10-CM | POA: Diagnosis not present

## 2022-04-09 DIAGNOSIS — I10 Essential (primary) hypertension: Secondary | ICD-10-CM | POA: Diagnosis not present

## 2022-04-09 MED ORDER — LISINOPRIL 20 MG PO TABS: 20.0000 mg | ORAL_TABLET | Freq: Every day | ORAL | 0 refills | Status: DC

## 2022-04-09 MED ORDER — AMLODIPINE BESYLATE 5 MG PO TABS: 5.0000 mg | ORAL_TABLET | Freq: Every day | ORAL | 0 refills | Status: DC

## 2022-04-09 MED ORDER — METOPROLOL TARTRATE 25 MG PO TABS: 25.0000 mg | ORAL_TABLET | Freq: Two times a day (BID) | ORAL | 1 refills | Status: DC

## 2022-04-09 MED ORDER — METOPROLOL TARTRATE 25 MG PO TABS: 25.0000 mg | ORAL_TABLET | Freq: Two times a day (BID) | ORAL | 1 refills | Status: AC

## 2022-04-09 NOTE — Patient Instructions (Signed)
You were seen today in the [facility] for . Labs were collected, results will be available via MyChart or, if abnormal, you will be contacted by clinic staff. You were prescribed medications, please take as directed. Please follow up in 6 mths for reevaluation.  ?

## 2022-04-09 NOTE — Progress Notes (Signed)
? ?[facility] ?[place], [place]  [address] ?Phone:  [phone]   Fax:  [phone] ?Subjective:  ? Patient ID: Logan Lamb, male    DOB: 05-11-1981, 41 y.o.   MRN: [id number] ? ?Chief Complaint  ?Patient presents with  ? Establish Care  ?  Patient is here today to establish care and get back on his blood pressure medications.   ? ?HPI ?Logan Lamb 41 y.o. male  has a past medical history of Hypertension. To the [facility] to establish care and for reevaluation of hypertension. Last visit with PCP 1-2 yrs ago.  ? ?States that he has been compliant with B/P medications and requesting they be refilled. Recently completed DOT physical and was referred to PCP for further evaluation and management of B/P. Currently works as a Engineer, drilling, does not monitor meals or exercise regularly.  ? ?Endorses recent increase in stressors, with increased tobacco usage. States that stressors surround children. Denies any other concerns today.  ? ?Denies any fatigue, chest pain, shortness of breath, HA or dizziness. Denies any blurred vision, numbness or tingling. ? ?Past Medical History:  ?Diagnosis Date  ? Hypertension   ? ? ?Past Surgical History:  ?Procedure Laterality Date  ? LAPAROSCOPIC APPENDECTOMY N/A 05/22/2020  ? Procedure: APPENDECTOMY LAPAROSCOPIC;  Surgeon: Ronny Bacon, MD;  Location: [facility] ORS;  Service: General;  Laterality: N/A;  ? ? ?Family History  ?Family history unknown: Yes  ? ? ?Social History  ? ?Socioeconomic History  ? Marital status: Single  ?  Spouse name: Not on file  ? Number of children: Not on file  ? Years of education: Not on file  ? Highest education level: Not on file  ?Occupational History  ? Not on file  ?Tobacco Use  ? Smoking status: Every Day  ?  Packs/day: 0.50  ?  Types: Cigarettes  ? Smokeless tobacco: Former  ?Vaping Use  ? Vaping Use: Never used  ?Substance and Sexual Activity  ? Alcohol use: Yes  ?  Comment: daily  ? Drug use: No  ? Sexual  activity: Yes  ?  Birth control/protection: Condom  ?Other Topics Concern  ? Not on file  ?Social History Narrative  ? Not on file  ? ?Social Determinants of Health  ? ?Financial Resource Strain: Not on file  ?Food Insecurity: Not on file  ?Transportation Needs: Not on file  ?Physical Activity: Not on file  ?Stress: Not on file  ?Social Connections: Not on file  ?Intimate Partner Violence: Not on file  ? ? ?Outpatient Medications Prior to Visit  ?Medication Sig Dispense Refill  ? ondansetron (ZOFRAN) 4 MG tablet Take 1 tablet (4 mg total) by mouth every 8 (eight) hours as needed for up to 10 doses for nausea or vomiting. 10 tablet 0  ? Tadalafil 2.5 MG TABS Take 1 tablet by mouth daily.    ? amLODipine (NORVASC) 5 MG tablet Take 5 mg by mouth daily.    ? lisinopril (ZESTRIL) 20 MG tablet Take 20 mg by mouth daily.    ? benzonatate (TESSALON PERLES) 100 MG capsule Take 1 capsule (100 mg total) by mouth 3 (three) times daily as needed for cough. 30 capsule 0  ? cyclobenzaprine (FLEXERIL) 10 MG tablet Take 0.5 tablets (5 mg total) by mouth 3 (three) times daily as needed for muscle spasms. 30 tablet 0  ? meloxicam (MOBIC) 15 MG tablet Take 1 tablet (15 mg total) by mouth daily. [place]  tablet 0  ? metoprolol tartrate (LOPRESSOR) 25 MG tablet Take 1 tablet (25 mg total) by mouth 2 (two) times daily. 60 tablet 1  ? PARoxetine (PAXIL) 10 MG tablet Take by mouth.    ? ?No facility-administered medications prior to visit.  ? ? ?No Known Allergies ? ?Review of Systems  ?Constitutional: Negative.  Negative for chills, fever and malaise/fatigue.  ?HENT: Negative.    ?Eyes: Negative.   ?Respiratory:  Negative for cough and shortness of breath.   ?Cardiovascular:  Negative for chest pain, palpitations and leg swelling.  ?Gastrointestinal:  Negative for abdominal pain, blood in stool, constipation, diarrhea, nausea and vomiting.  ?Genitourinary: Negative.   ?Musculoskeletal: Negative.   ?Skin: Negative.   ?Neurological: Negative.    ?Psychiatric/Behavioral:  Negative for depression. The patient is not nervous/anxious.   ?All other systems reviewed and are negative. ? ?   ?Objective:  ?  ?Physical Exam ?Vitals reviewed.  ?Constitutional:   ?   General: He is not in acute distress. ?   Appearance: Normal appearance.  ?HENT:  ?   Head: Normocephalic.  ?   Right Ear: Tympanic membrane, ear canal and external ear normal. There is no impacted cerumen.  ?   Left Ear: Tympanic membrane, ear canal and external ear normal. There is no impacted cerumen.  ?   Nose: Nose normal. No congestion or rhinorrhea.  ?   Mouth/Throat:  ?   Mouth: Mucous membranes are moist.  ?   Pharynx: Oropharynx is clear. No oropharyngeal exudate or posterior oropharyngeal erythema.  ?Eyes:  ?   General: No scleral icterus.    ?   Right eye: No discharge.     ?   Left eye: No discharge.  ?   Extraocular Movements: Extraocular movements intact.  ?   Conjunctiva/sclera: Conjunctivae normal.  ?   Pupils: Pupils are equal, round, and reactive to light.  ?Neck:  ?   Vascular: No carotid bruit.  ?Cardiovascular:  ?   Rate and Rhythm: Normal rate and regular rhythm.  ?   Pulses: Normal pulses.  ?   Heart sounds: Normal heart sounds.  ?   Comments: No obvious peripheral edema ?Pulmonary:  ?   Effort: Pulmonary effort is normal.  ?   Breath sounds: Normal breath sounds.  ?Abdominal:  ?   General: Abdomen is flat. Bowel sounds are normal. There is no distension.  ?   Palpations: Abdomen is soft. There is no mass.  ?   Tenderness: There is no abdominal tenderness. There is no right CVA tenderness, left CVA tenderness, guarding or rebound.  ?   Hernia: No hernia is present.  ?Musculoskeletal:     ?   General: No swelling, tenderness, deformity or signs of injury. Normal range of motion.  ?   Cervical back: Normal range of motion and neck supple. No rigidity or tenderness.  ?   Right lower leg: No edema.  ?   Left lower leg: No edema.  ?Lymphadenopathy:  ?   Cervical: No cervical adenopathy.   ?Skin: ?   General: Skin is warm and dry.  ?   Capillary Refill: Capillary refill takes less than 2 seconds.  ?Neurological:  ?   General: No focal deficit present.  ?   Mental Status: He is alert and oriented to person, place, and time.  ?Psychiatric:     ?   Mood and Affect: Mood normal.     ?   Behavior: Behavior normal.     ?  Thought Content: Thought content normal.     ?   Judgment: Judgment normal.  ? ? ?BP 121/85   Pulse 76   Temp (!) 97.2 ?F (36.2 ?C)   Ht 5' 5.5" (1.664 m)   Wt 164 lb 9.6 oz (74.7 kg)   SpO2 98%   BMI 26.97 kg/m?  ?Wt Readings from Last 3 Encounters:  ?04/09/22 164 lb 9.6 oz (74.7 kg)  ?10/01/21 180 lb (81.6 kg)  ?08/16/20 160 lb (72.6 kg)  ? ? ?Immunization History  ?Administered Date(s) Administered  ? Tdap 04/23/2016  ? Unspecified SARS-COV-2 Vaccination 01/18/2020, 02/15/2020, 04/11/2020  ? ? ?Diabetic Foot Exam - Simple   ?No data filed ?  ? ? ?No results found for: TSH ?Lab Results  ?Component Value Date  ? WBC 7.2 04/09/2022  ? HGB 17.9 (H) 04/09/2022  ? HCT 50.2 04/09/2022  ? MCV 90 04/09/2022  ? PLT 308 04/09/2022  ? ?Lab Results  ?Component Value Date  ? NA 139 04/09/2022  ? K 4.6 04/09/2022  ? CO2 25 04/09/2022  ? GLUCOSE 70 04/09/2022  ? BUN 10 04/09/2022  ? CREATININE 1.18 04/09/2022  ? BILITOT 0.4 04/09/2022  ? ALKPHOS 88 04/09/2022  ? AST 22 04/09/2022  ? ALT 23 04/09/2022  ? PROT 8.0 04/09/2022  ? ALBUMIN 5.1 (H) 04/09/2022  ? CALCIUM 10.1 04/09/2022  ? ANIONGAP 7 10/01/2021  ? EGFR 80 04/09/2022  ? ?Lab Results  ?Component Value Date  ? CHOL 252 (H) 04/09/2022  ? ?Lab Results  ?Component Value Date  ? HDL 36 (L) 04/09/2022  ? ?Lab Results  ?Component Value Date  ? LDLCALC 169 (H) 04/09/2022  ? ?Lab Results  ?Component Value Date  ? TRIG 249 (H) 04/09/2022  ? ?Lab Results  ?Component Value Date  ? CHOLHDL 7.0 (H) 04/09/2022  ? ?No results found for: HGBA1C ? ?   ?Assessment & Plan:  ? ?Problem List Items Addressed This Visit   ?None ?Visit Diagnoses   ? ? Primary  hypertension    -  Primary  ? Relevant Medications  ? Tadalafil 2.5 MG TABS  ? amLODipine (NORVASC) 5 MG tablet  ? lisinopril (ZESTRIL) 20 MG tablet  ? metoprolol tartrate (LOPRESSOR) 25 MG tablet ?Medications   ? Wel

## 2022-04-10 LAB — CBC WITH DIFFERENTIAL/PLATELET
Basophils Absolute: 0 10*3/uL (ref 0.0–0.2)
Basos: 1 %
EOS (ABSOLUTE): 0.1 10*3/uL (ref 0.0–0.4)
Eos: 1 %
Hematocrit: 50.2 % (ref 37.5–51.0)
Hemoglobin: 17.9 g/dL — ABNORMAL HIGH (ref 13.0–17.7)
Immature Grans (Abs): 0 10*3/uL (ref 0.0–0.1)
Immature Granulocytes: 0 %
Lymphocytes Absolute: 1.3 10*3/uL (ref 0.7–3.1)
Lymphs: 18 %
MCH: 32.2 pg (ref 26.6–33.0)
MCHC: 35.7 g/dL (ref 31.5–35.7)
MCV: 90 fL (ref 79–97)
Monocytes Absolute: 0.9 10*3/uL (ref 0.1–0.9)
Monocytes: 12 %
Neutrophils Absolute: 5 10*3/uL (ref 1.4–7.0)
Neutrophils: 68 %
Platelets: 308 10*3/uL (ref 150–450)
RBC: 5.56 x10E6/uL (ref 4.14–5.80)
RDW: 13.7 % (ref 11.6–15.4)
WBC: 7.2 10*3/uL (ref 3.4–10.8)

## 2022-04-10 LAB — COMPREHENSIVE METABOLIC PANEL
ALT: 23 IU/L (ref 0–44)
AST: 22 IU/L (ref 0–40)
Albumin/Globulin Ratio: 1.8 (ref 1.2–2.2)
Albumin: 5.1 g/dL — ABNORMAL HIGH (ref 4.0–5.0)
Alkaline Phosphatase: 88 IU/L (ref 44–121)
BUN/Creatinine Ratio: 8 — ABNORMAL LOW (ref 9–20)
BUN: 10 mg/dL (ref 6–24)
Bilirubin Total: 0.4 mg/dL (ref 0.0–1.2)
CO2: 25 mmol/L (ref 20–29)
Calcium: 10.1 mg/dL (ref 8.7–10.2)
Chloride: 99 mmol/L (ref 96–106)
Creatinine, Ser: 1.18 mg/dL (ref 0.76–1.27)
Globulin, Total: 2.9 g/dL (ref 1.5–4.5)
Glucose: 70 mg/dL (ref 70–99)
Potassium: 4.6 mmol/L (ref 3.5–5.2)
Sodium: 139 mmol/L (ref 134–144)
Total Protein: 8 g/dL (ref 6.0–8.5)
eGFR: 80 mL/min/{1.73_m2} (ref >59)

## 2022-04-10 LAB — LIPID PANEL
Chol/HDL Ratio: 7 ratio — ABNORMAL HIGH (ref 0.0–5.0)
Cholesterol, Total: 252 mg/dL — ABNORMAL HIGH (ref 100–199)
HDL: 36 mg/dL — ABNORMAL LOW (ref >39)
LDL Chol Calc (NIH): 169 mg/dL — ABNORMAL HIGH (ref 0–99)
Triglycerides: 249 mg/dL — ABNORMAL HIGH (ref 0–149)
VLDL Cholesterol Cal: 47 mg/dL — ABNORMAL HIGH (ref 5–40)

## 2022-04-11 ENCOUNTER — Other Ambulatory Visit: Payer: Self-pay | Admitting: Nurse Practitioner

## 2022-04-11 DIAGNOSIS — E782 Mixed hyperlipidemia: Secondary | ICD-10-CM

## 2022-04-11 MED ORDER — ATORVASTATIN CALCIUM 40 MG PO TABS: 40.0000 mg | ORAL_TABLET | Freq: Every day | ORAL | 3 refills | Status: AC

## 2022-04-11 MED ORDER — OMEGA-3-ACID ETHYL ESTERS 1 G PO CAPS: 2.0000 g | ORAL_CAPSULE | Freq: Every day | ORAL | 2 refills | Status: AC

## 2022-04-27 ENCOUNTER — Encounter: Payer: Self-pay | Admitting: Emergency Medicine

## 2022-04-27 ENCOUNTER — Emergency Department
Admission: EM | Admit: 2022-04-27 | Discharge: 2022-04-27 | Disposition: A | Discharge status: Home / Self Care | Payer: 59 | Attending: Emergency Medicine | Admitting: Emergency Medicine

## 2022-04-27 ENCOUNTER — Emergency Department: Payer: 59

## 2022-04-27 DIAGNOSIS — I10 Essential (primary) hypertension: Secondary | ICD-10-CM | POA: Diagnosis not present

## 2022-04-27 DIAGNOSIS — R059 Cough, unspecified: Secondary | ICD-10-CM | POA: Diagnosis present

## 2022-04-27 DIAGNOSIS — Z20822 Contact with and (suspected) exposure to covid-19: Secondary | ICD-10-CM | POA: Diagnosis not present

## 2022-04-27 DIAGNOSIS — J069 Acute upper respiratory infection, unspecified: Secondary | ICD-10-CM | POA: Insufficient documentation

## 2022-04-27 LAB — RESP PANEL BY RT-PCR (FLU A&B, COVID) ARPGX2
Influenza A by PCR: NEGATIVE
Influenza B by PCR: NEGATIVE
SARS Coronavirus 2 by RT PCR: NEGATIVE

## 2022-04-27 MED ORDER — GUAIFENESIN-CODEINE 100-10 MG/5ML PO SYRP: 5.0000 mL | ORAL_SOLUTION | Freq: Three times a day (TID) | ORAL | 0 refills | Status: DC | PRN

## 2022-04-27 NOTE — ED Provider Notes (Signed)
   [facility] Provider Note    None    (approximate)   History   Cough   HPI  Logan Lamb is a 41 y.o. male with history of hypertension, hyperlipidemia, and as listed in EMR presents to the emergency department for treatment and evaluation of cough and nasal congestion for the past 5 days. No relief with Tylenol PM or Sudafed.       Physical Exam   Triage Vital Signs: ED Triage Vitals  Enc Vitals Group     BP 04/27/22 2201 (!) 155/89     Pulse Rate 04/27/22 2201 83     Resp 04/27/22 2201 18     Temp 04/27/22 2201 99.7 F (37.6 C)     Temp src --      SpO2 04/27/22 2201 96 %     Weight 04/27/22 2200 160 lb (72.6 kg)     Height 04/27/22 2200 5\' 5"  (1.651 m)     Head Circumference --      Peak Flow --      Pain Score --      Pain Loc --      Pain Edu? --      Excl. in GC? --     Most recent vital signs: Vitals:   04/27/22 2201  BP: (!) 155/89  Pulse: 83  Resp: 18  Temp: 99.7 F (37.6 C)  SpO2: 96%    General: Awake, no distress.  CV:  Good peripheral perfusion.  Resp:  Normal effort. Breath sounds clear to auscultation Abd:  No distention.  Other:  04/29/22 ED Results / Procedures / Treatments   Labs (all labs ordered are listed, but only abnormal results are displayed) Labs Reviewed  RESP PANEL BY RT-PCR (FLU A&B, COVID) [id number]     EKG     RADIOLOGY  Image and radiology report reviewed by me.  Chest x-ray viewed and interpreted by me.  No acute cardiopulmonary abnormality.  PROCEDURES:  Critical Care performed: No  Procedures   MEDICATIONS ORDERED IN ED: Medications - No data to display   IMPRESSION / MDM / ASSESSMENT AND PLAN / ED COURSE   I have reviewed the triage note.  Differential diagnosis includes, but is not limited to: Viral syndrome, COVID, influenza, pneumonia, bronchitis  41 year old male presenting to the emergency department for treatment and evaluation of cough and nasal congestion for  the past 5 days.  See HPI for further details.  Chest x-ray is negative for acute cardiopulmonary abnormality.  Breath sounds are clear to auscultation.  COVID and influenza testing is negative.  Will discharge home with a prescription for Robitussin-AC and a work excuse for tomorrow.  He was encouraged to follow-up with his primary care provider if not improving over the next week or sooner if he feels worse.      FINAL CLINICAL IMPRESSION(S) / ED DIAGNOSES   Final diagnoses:  Acute upper respiratory infection     Rx / DC Orders   ED Discharge Orders          Ordered    guaiFENesin-codeine (ROBITUSSIN AC) 100-10 MG/5ML syrup  3 times daily PRN        04/27/22 2323             Note:  This document was prepared using Dragon voice recognition software and may include unintentional dictation errors.   2324, FNP 04/27/22 2324    04/29/22, MD 04/29/22 [phone]

## 2022-04-27 NOTE — ED Triage Notes (Signed)
Pt c/o cough and nasal congestion x5 days without relief from OTC medication. Pt denies fever.

## 2022-05-08 IMAGING — CR DG RIBS W/ CHEST 3+V*L*
3 series · 3 of 3 positions shown · non-contrast
Comparison: Chest 07/28/2016

CLINICAL DATA: Left back and rib pain after a fall

EXAM:
LEFT RIBS AND CHEST - 3+ VIEW

[chest pa]
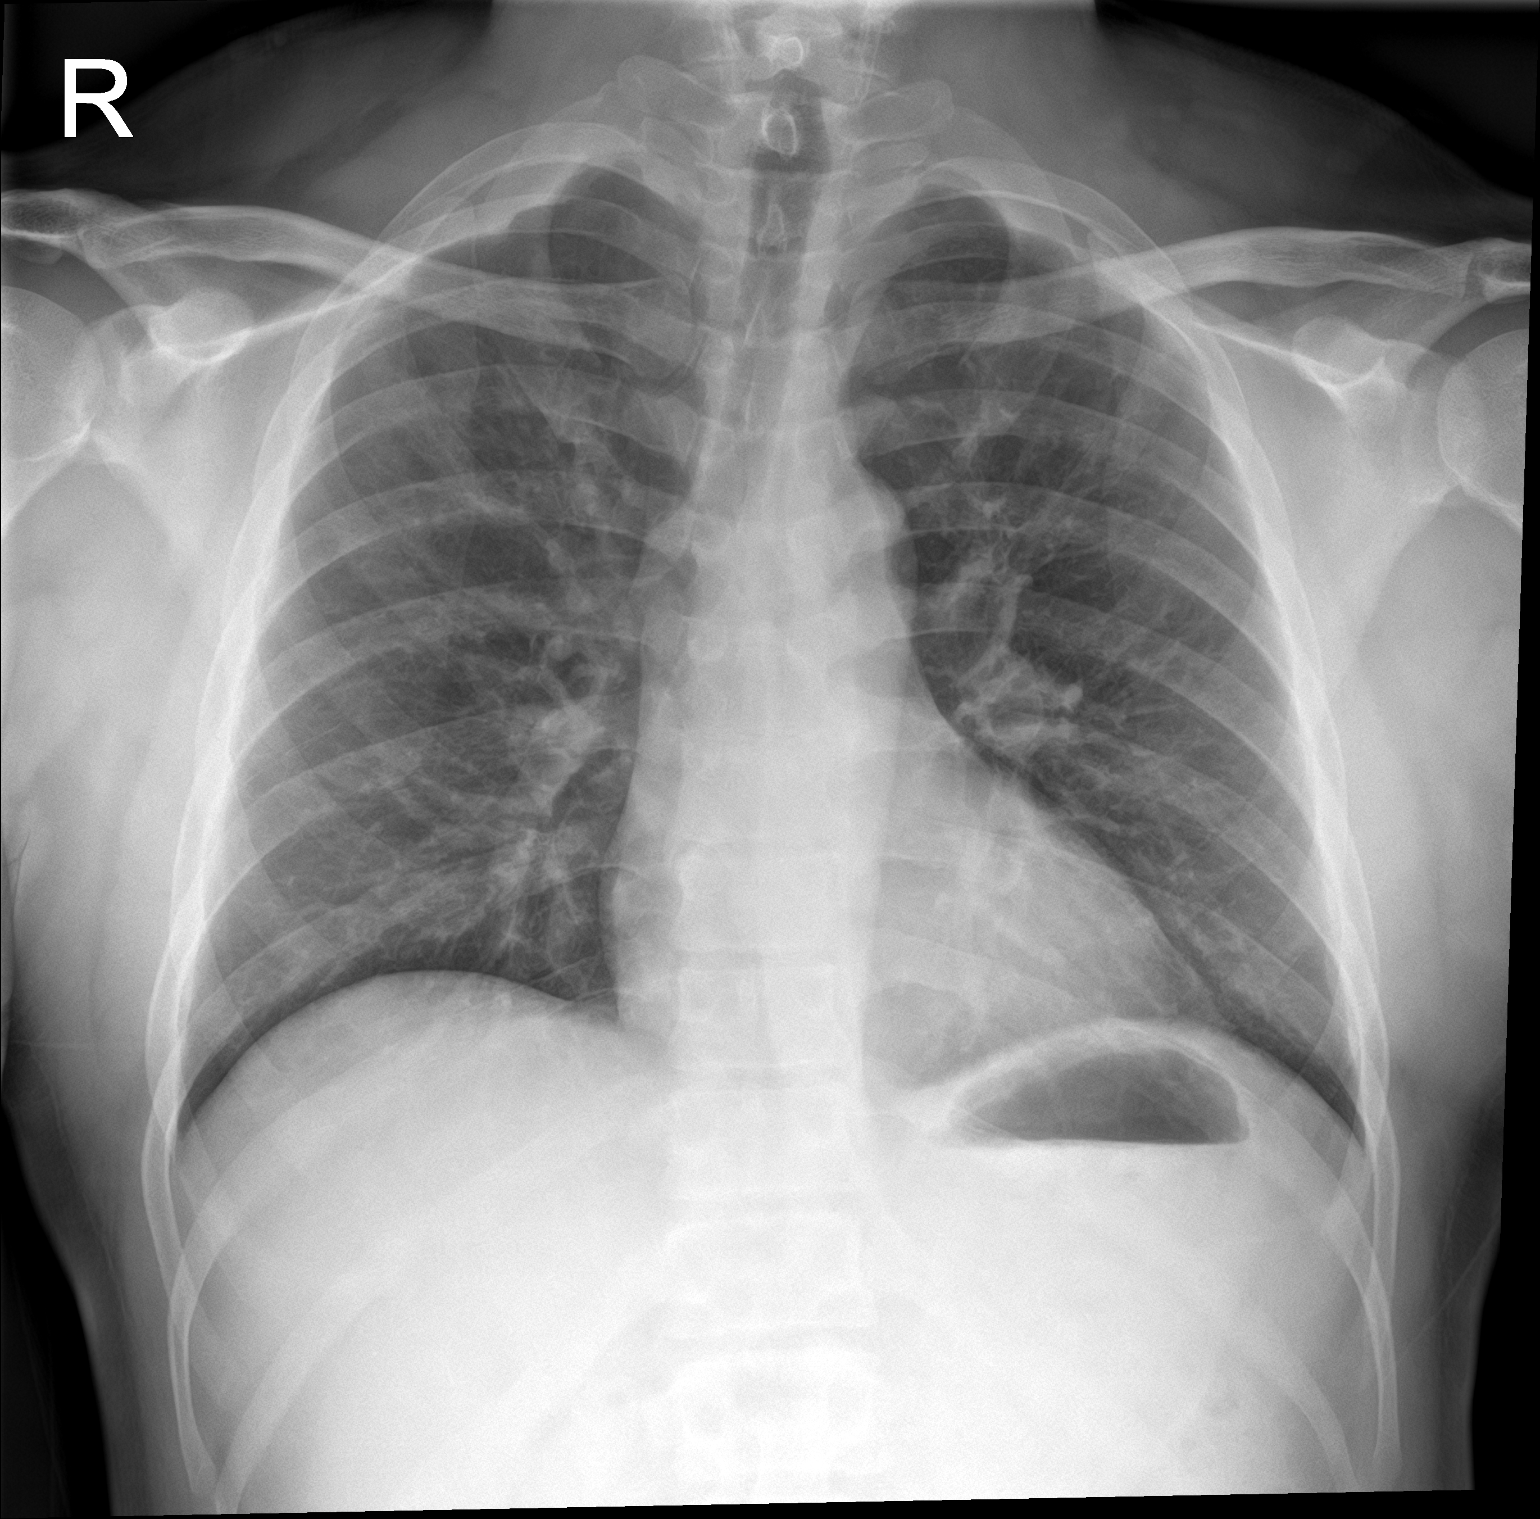

[rib pa]
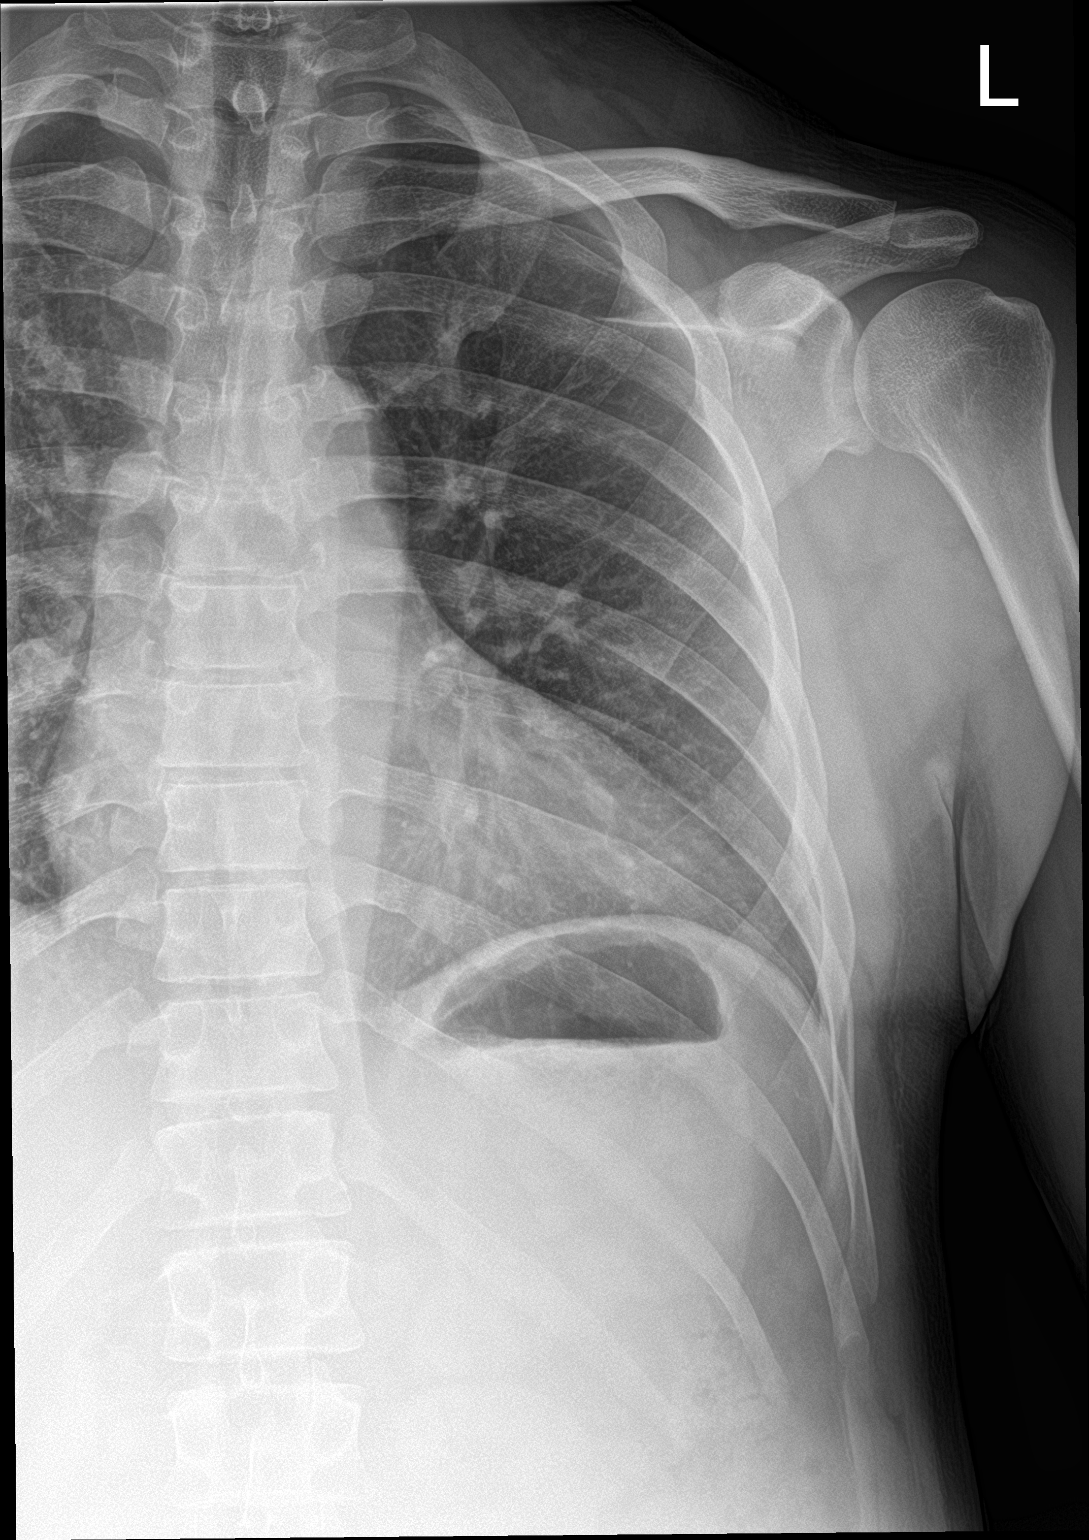

[rib pa obl]
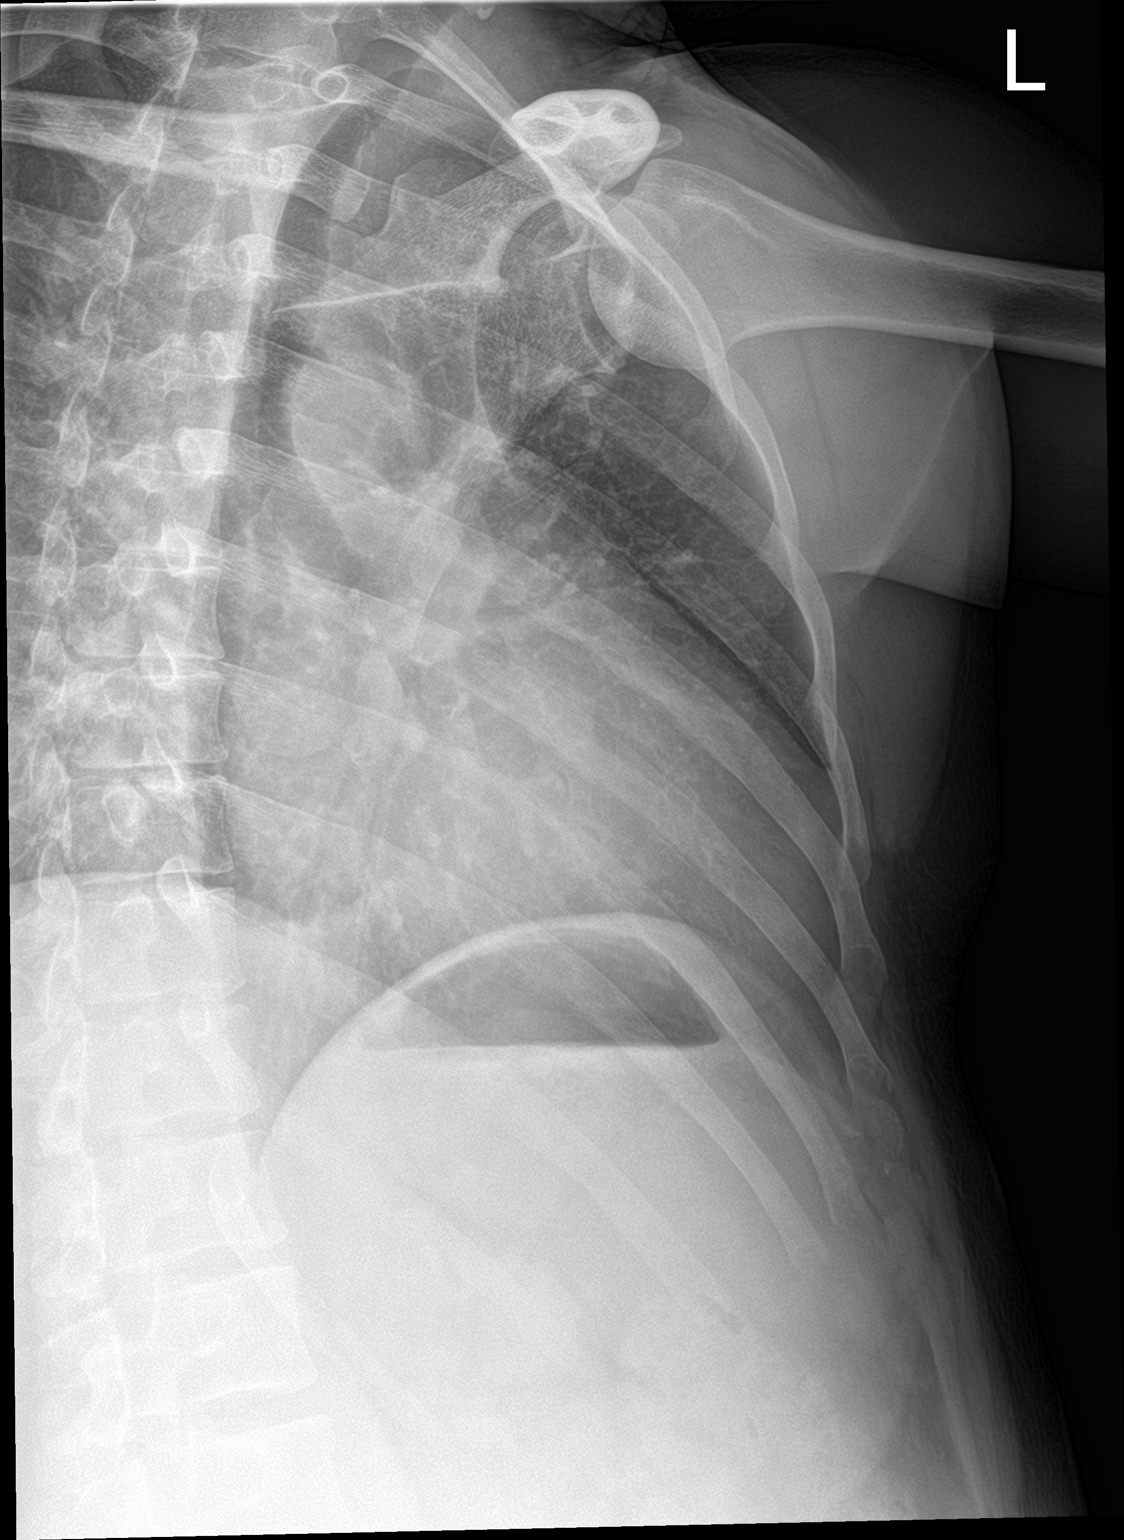

[3 of 3 positions shown; findings below may reference images not displayed]

FINDINGS: Normal heart size and pulmonary vascularity. No focal airspace
disease or consolidation in the lungs. No blunting of costophrenic
angles. No pneumothorax. Mediastinal contours appear intact.

Left ribs appear intact. No acute fracture or dislocation. No focal
bone lesion or bone destruction. Bone cortex appears intact. Soft
tissues are unremarkable.
IMPRESSION: No evidence of active pulmonary disease.  Negative left ribs.

## 2022-06-09 ENCOUNTER — Ambulatory Visit: Payer: Self-pay | Admitting: Nurse Practitioner

## 2022-09-26 ENCOUNTER — Other Ambulatory Visit: Payer: Self-pay | Admitting: Nurse Practitioner

## 2022-09-26 DIAGNOSIS — I10 Essential (primary) hypertension: Secondary | ICD-10-CM

## 2022-10-10 ENCOUNTER — Emergency Department (HOSPITAL_COMMUNITY)
Admission: EM | Admit: 2022-10-10 | Discharge: 2022-10-10 | Disposition: A | Discharge status: Home / Self Care | Payer: Medicaid Other | Attending: Emergency Medicine | Admitting: Emergency Medicine

## 2022-10-10 ENCOUNTER — Other Ambulatory Visit: Payer: Self-pay

## 2022-10-10 ENCOUNTER — Emergency Department (HOSPITAL_COMMUNITY): Payer: Medicaid Other

## 2022-10-10 ENCOUNTER — Encounter (HOSPITAL_COMMUNITY): Payer: Self-pay

## 2022-10-10 DIAGNOSIS — R220 Localized swelling, mass and lump, head: Secondary | ICD-10-CM

## 2022-10-10 DIAGNOSIS — R0789 Other chest pain: Secondary | ICD-10-CM | POA: Diagnosis present

## 2022-10-10 DIAGNOSIS — K130 Diseases of lips: Secondary | ICD-10-CM | POA: Insufficient documentation

## 2022-10-10 DIAGNOSIS — X503XXA Overexertion from repetitive movements, initial encounter: Secondary | ICD-10-CM | POA: Insufficient documentation

## 2022-10-10 DIAGNOSIS — Z7982 Long term (current) use of aspirin: Secondary | ICD-10-CM | POA: Insufficient documentation

## 2022-10-10 DIAGNOSIS — Z79899 Other long term (current) drug therapy: Secondary | ICD-10-CM | POA: Diagnosis not present

## 2022-10-10 LAB — CBC WITH DIFFERENTIAL/PLATELET
Abs Immature Granulocytes: 0.01 10*3/uL (ref 0.00–0.07)
Basophils Absolute: 0 10*3/uL (ref 0.0–0.1)
Basophils Relative: 1 %
Eosinophils Absolute: 0 10*3/uL (ref 0.0–0.5)
Eosinophils Relative: 0 %
HCT: 52.4 % — ABNORMAL HIGH (ref 39.0–52.0)
Hemoglobin: 17.9 g/dL — ABNORMAL HIGH (ref 13.0–17.0)
Immature Granulocytes: 0 %
Lymphocytes Relative: 6 %
Lymphs Abs: 0.5 10*3/uL — ABNORMAL LOW (ref 0.7–4.0)
MCH: 32.3 pg (ref 26.0–34.0)
MCHC: 34.2 g/dL (ref 30.0–36.0)
MCV: 94.6 fL (ref 80.0–100.0)
Monocytes Absolute: 0.2 10*3/uL (ref 0.1–1.0)
Monocytes Relative: 3 %
Neutro Abs: 7.4 10*3/uL (ref 1.7–7.7)
Neutrophils Relative %: 90 %
Platelets: 270 10*3/uL (ref 150–400)
RBC: 5.54 MIL/uL (ref 4.22–5.81)
RDW: 13.3 % (ref 11.5–15.5)
WBC: 8.3 10*3/uL (ref 4.0–10.5)
nRBC: 0 % (ref 0.0–0.2)

## 2022-10-10 LAB — TROPONIN I (HIGH SENSITIVITY): Troponin I (High Sensitivity): 8 ng/L (ref <18)

## 2022-10-10 LAB — BASIC METABOLIC PANEL
Anion gap: 10 (ref 5–15)
BUN: 11 mg/dL (ref 6–20)
CO2: 26 mmol/L (ref 22–32)
Calcium: 9.4 mg/dL (ref 8.9–10.3)
Chloride: 101 mmol/L (ref 98–111)
Creatinine, Ser: 0.9 mg/dL (ref 0.61–1.24)
GFR, Estimated: >60 mL/min (ref >60)
Glucose, Bld: 91 mg/dL (ref 70–99)
Potassium: 4.5 mmol/L (ref 3.5–5.1)
Sodium: 137 mmol/L (ref 135–145)

## 2022-10-10 MED ORDER — LIDOCAINE 5 % EX PTCH
1.0000 | MEDICATED_PATCH | CUTANEOUS | Status: DC
  Administered 2022-10-10: 1 via TRANSDERMAL
  Filled 2022-10-10: qty 1

## 2022-10-10 MED ORDER — HYDROCHLOROTHIAZIDE 25 MG PO TABS: 25.0000 mg | ORAL_TABLET | Freq: Every day | ORAL | 1 refills | Status: AC

## 2022-10-10 NOTE — ED Provider Triage Note (Signed)
Emergency Medicine Provider Triage Evaluation Note  Logan Lamb , a 41 y.o. male  was evaluated in triage.  Pt complains of swelling to his left upper lip.  He is on ACE inhibitor.  No history of the same, denies any hives, nausea, vomiting, dysphagia, tongue swelling, tickling in the back of his throat..  No history of allergic reactions.  He is also been having chest pain intermittently for the past few months to the right side of his chest which is worse with movement.  This worsened today.  Given aspirin, 0.4 nitro, Benadryl, Decadron, Pepcid by EMS.  No changes.  The swelling has been roughly the same since 5 AM this morning..  Review of Systems  Per HPI  Physical Exam  There were no vitals taken for this visit. Gen:   Awake, no distress   Resp:  Normal effort  MSK:   Moves extremities without difficulty  Other:  Swelling to left upper lip.  Tongue midline, no edema.  Handling secretions, protecting airway.  No stridor on exam.  Reproducible chest wall tenderness.  Medical Decision Making  Medically screening exam initiated at 10:34 AM.  Appropriate orders placed.  JOVONI BORKENHAGEN was informed that the remainder of the evaluation will be completed by another provider, this initial triage assessment does not replace that evaluation, and the importance of remaining in the ED until their evaluation is complete.     Sherrill Raring, PA-C 10/10/22 1035

## 2022-10-10 NOTE — ED Provider Notes (Signed)
[facility] EM[facility]GENC[facility] DEPART[facility] Provider Note   CSN: [id number] Arrival date & time: 10/10/22  1026     History {Add pertinent medical, surgical, social history, OB history to HPI:1} Chief Complaint  Patient presents with   Chest Pain   Oral Swelling    Logan Lamb is a 41 y.o. male.   Chest Pain 41 year old male with past medical history  Presented emergency room today with complaints of continued swelling to his left upper lip for the past few days.  This prompted him to go to the urgent care where he was evaluated initially however patient states that he has some pain in the in his sternum/right chest which prompted urgent care to send patient to emergency room for evaluation.  He states that it is not sternal and is not exertional.  He states it is worse when he is taking a deep breath.  He states that over the past 1 year he has had multiple episodes of this.  Denies any nausea vomiting shortness of breath or diaphoresis.  He states that moving his torso left and right to make his pain worse.  He states that he does work that requires repetitive movements with his chest and torso and arms.  He denies any back pain nausea lightness or dizziness.     Home Medications Prior to Admission medications   Medication Sig Start Date End Date Taking? Authorizing Provider  atorvastatin (LIPITOR) 40 MG tablet Take 1 tablet (40 mg total) by mouth daily. 04/11/22   Passmore, Enid Derry I, NP  omega-3 acid ethyl esters (LOVAZA) 1 g capsule Take 2 capsules (2 g total) by mouth daily. 04/11/22 07/10/22  Orion Crook I, NP  amLODipine (NORVASC) 5 MG tablet Take 1 tablet by mouth once daily 09/26/22   Ivonne Andrew, NP  guaiFENesin-codeine (ROBITUSSIN AC) 100-10 MG/5ML syrup Take 5 mLs by mouth 3 (three) times daily as needed for cough. 04/27/22   Triplett, Rulon Eisenmenger B, FNP  lisinopril (ZESTRIL) 20 MG tablet Take 1 tablet by mouth once daily 09/26/22   Ivonne Andrew, NP  metoprolol  tartrate (LOPRESSOR) 25 MG tablet Take 1 tablet (25 mg total) by mouth 2 (two) times daily. 04/09/22 10/06/22  Passmore, Enid Derry I, NP  ondansetron (ZOFRAN) 4 MG tablet Take 1 tablet (4 mg total) by mouth every 8 (eight) hours as needed for up to 10 doses for nausea or vomiting. 10/01/21   Gilles Chiquito, MD  Tadalafil 2.5 MG TABS Take 1 tablet by mouth daily. 11/14/21   [provider]      Allergies    Patient has no known allergies.    Review of Systems   Review of Systems  Cardiovascular:  Positive for chest pain.    Physical Exam Updated Vital Signs BP 126/77 (BP Location: Left Arm)   Pulse 63   Temp 98.4 F (36.9 C) (Oral)   Resp 16   Ht 5\' 5"  (1.651 m)   Wt 70.8 kg   SpO2 100%   BMI 25.96 kg/m  Physical Exam Vitals and nursing note reviewed.  Constitutional:      General: He is not in acute distress. HENT:     Head: Normocephalic and atraumatic.     Nose: Nose normal.  Eyes:     General: No scleral icterus. Cardiovascular:     Rate and Rhythm: Normal rate and regular rhythm.     Pulses: Normal pulses.     Heart sounds: Normal heart sounds.  Pulmonary:     Effort: Pulmonary effort is normal. No respiratory distress.     Breath sounds: No wheezing.  Abdominal:     Palpations: Abdomen is soft.     Tenderness: There is no abdominal tenderness.  Musculoskeletal:     Cervical back: Normal range of motion.     Right lower leg: No edema.     Left lower leg: No edema.  Skin:    General: Skin is warm and dry.     Capillary Refill: Capillary refill takes less than 2 seconds.  Neurological:     Mental Status: He is alert. Mental status is at baseline.  Psychiatric:        Mood and Affect: Mood normal.        Behavior: Behavior normal.     ED Results / Procedures / Treatments   Labs (all labs ordered are listed, but only abnormal results are displayed) Labs Reviewed  CBC WITH DIFFERENTIAL/PLATELET - Abnormal; Notable for the following components:       Result Value   Hemoglobin 17.9 (*)    HCT 52.4 (*)    Lymphs Abs 0.5 (*)    All other components within normal limits  BASIC METABOLIC PANEL  TROPONIN I (HIGH SENSITIVITY)    EKG EKG Interpretation  Date/Time:  Friday October 10 2022 10:37:55 EDT Ventricular Rate:  64 PR Interval:  172 QRS Duration: 94 QT Interval:  394 QTC Calculation: 406 R Axis:   47 Text Interpretation: Normal sinus rhythm Septal infarct , age undetermined Abnormal ECG When compared with ECG of 01-Oct-2021 12:00, PREVIOUS ECG IS PRESENT No significant change since last tracing Confirmed by Isla Pence [phone]) on 10/10/2022 12:03:07 PM  Radiology DG Chest 2 View  Result Date: 10/10/2022 CLINICAL DATA:  Provided history: Chest pain. Additional history provided: Patient reports intermittent chest pain for 2 months. EXAM: CHEST - 2 VIEW COMPARISON:  Prior chest radiographs 04/27/2022 and earlier. FINDINGS: Heart size within normal limits. No appreciable airspace consolidation. No evidence of pleural effusion or pneumothorax. No acute bony abnormality identified. IMPRESSION: No evidence of active cardiopulmonary disease. Electronically Signed   By: Kellie Simmering D.O.   On: 10/10/2022 11:25    Procedures Procedures  {Document cardiac monitor, telemetry assessment procedure when appropriate:1}  Medications Ordered in ED Medications  lidocaine (LIDODERM) 5 % 1 patch (has no administration in time range)    ED Course/ Medical Decision Making/ A&P                           Medical Decision Making Risk Prescription drug management.   ***  {Document critical care time when appropriate:1} {Document review of labs and clinical decision tools ie heart score, Chads2Vasc2 etc:1}  {Document your independent review of radiology images, and any outside records:1} {Document your discussion with family members, caretakers, and with consultants:1} {Document social determinants of health affecting pt's  care:1} {Document your decision making why or why not admission, treatments were needed:1} Final Clinical Impression(s) / ED Diagnoses Final diagnoses:  None    Rx / DC Orders ED Discharge Orders     None

## 2022-10-10 NOTE — ED Triage Notes (Addendum)
Pt BIB [facility] from urgent care c/o lip swelling that started around 5am. Pt is on lisinopril and has been taking it for years. Pt received 25mg  of benadryl, 324 ASA, 10mg  of decadron and pepcid with no relief. Pt denies any difficulty breathing or swallowing. Pt states he did start having right sided CP while at urgent care but has been intermittent for a few months and EMS gave 0.4 of nitro.

## 2022-10-10 NOTE — Discharge Instructions (Addendum)
Please begin taking hydrochlorothiazide/HCTZ as prescribed.  1 tablet once daily.  Take all of your other medications as prescribed.  Follow-up with your primary care doctor within the next 1 to 2 weeks for recheck of blood pressure.  Stop taking the lisinopril.  Do not take any more of this medication.   Return to the emergency room for any new or concerning symptoms such as chest pain that is worsening or different, shortness of breath, sweating or nausea or vomiting.

## 2022-10-15 ENCOUNTER — Ambulatory Visit: Payer: Self-pay | Admitting: Nurse Practitioner

## 2024-01-16 IMAGING — CR DG CHEST 2V
2 series · 2 of 2 positions shown · non-contrast
Comparison: None Available.

CLINICAL DATA: Cough

EXAM:
CHEST - 2 VIEW

[chest pa]
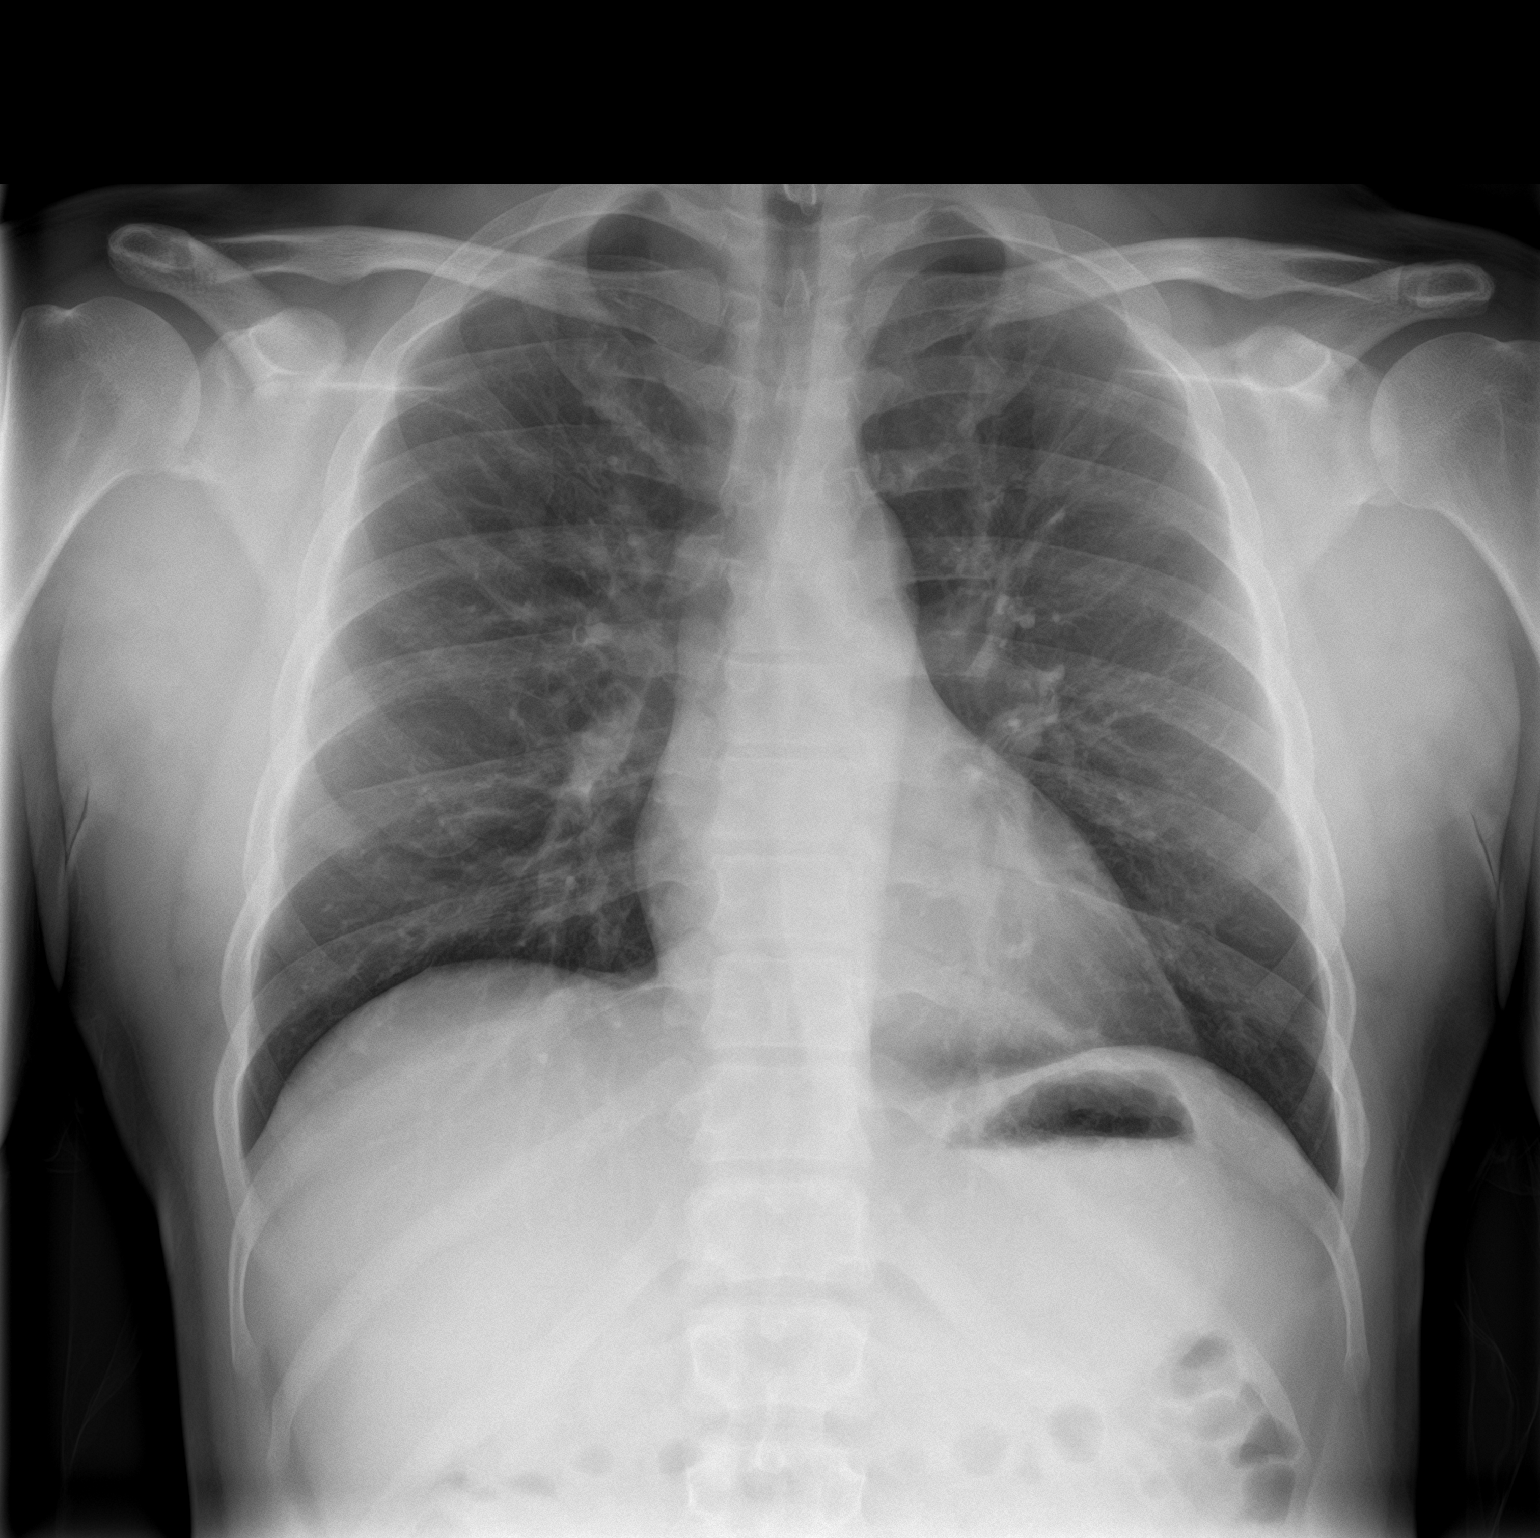

[chest lat]
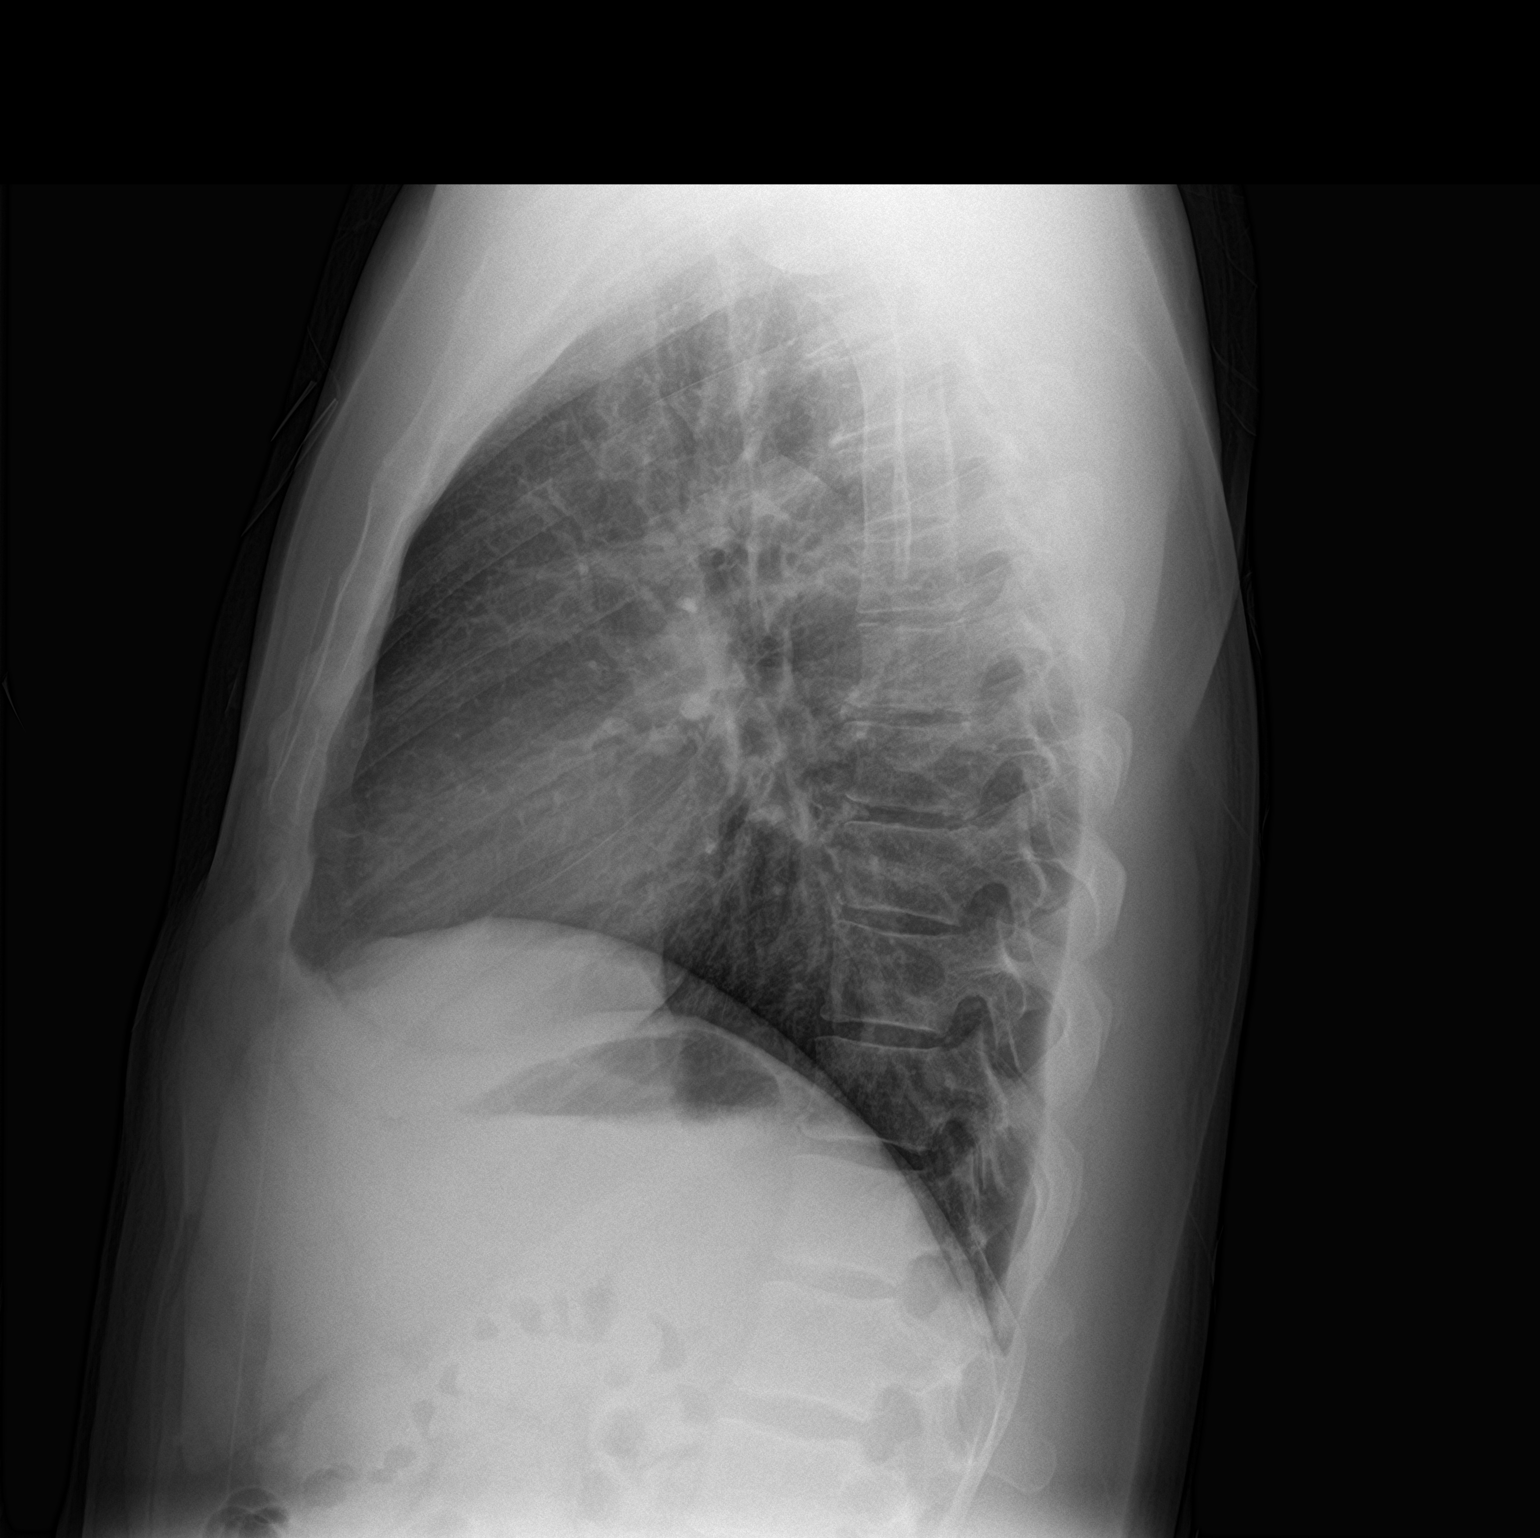

[2 of 2 positions shown; findings below may reference images not displayed]

FINDINGS: The heart size and mediastinal contours are within normal limits.
Both lungs are clear. The visualized skeletal structures are
unremarkable.
IMPRESSION: No active cardiopulmonary disease.
